# Patient Record
Sex: Female | Born: 1960 | Race: White | Hispanic: No | Marital: Married | Smoking: Former smoker
Health system: Southern US, Community
[De-identification: ages and names within clinical notes are randomized; demographics above are authoritative.]

## PROBLEM LIST (undated history)

## (undated) DIAGNOSIS — J3089 Other allergic rhinitis: Secondary | ICD-10-CM

## (undated) HISTORY — PX: BREAST ENHANCEMENT SURGERY: SHX7

## (undated) HISTORY — DX: Other allergic rhinitis: J30.89

---

## 2011-04-25 LAB — HM MAMMOGRAPHY: HM Mammogram: NORMAL

## 2012-03-11 ENCOUNTER — Encounter: Payer: Self-pay | Admitting: Endocrinology

## 2012-03-11 ENCOUNTER — Other Ambulatory Visit: Payer: BC Managed Care – PPO

## 2012-03-11 ENCOUNTER — Ambulatory Visit (INDEPENDENT_AMBULATORY_CARE_PROVIDER_SITE_OTHER): Payer: BC Managed Care – PPO | Admitting: Endocrinology

## 2012-03-11 VITALS — BP 122/82 | HR 63 | Temp 97.0°F | Ht 68.0 in | Wt 140.0 lb

## 2012-03-11 DIAGNOSIS — M810 Age-related osteoporosis without current pathological fracture: Secondary | ICD-10-CM

## 2012-03-11 MED ORDER — ALENDRONATE SODIUM 70 MG PO TABS: 70.0000 mg | ORAL_TABLET | ORAL | Status: DC

## 2012-03-11 NOTE — Progress Notes (Signed)
Subjective:    Patient ID: Alexandra Shelton, female    DOB: 10-15-1961, 51 y.o.   MRN: [id number]  HPI Pt says she was found to have mild osteoporosis in approx 2008.  She has not been on rx for this, except for vitamin-d rx.  Her next dexa was last month, and was worse.   She says she was noted to have hyperparathyroidism in 2008, but does not know if it was primary or secondary.  She was not given any rx for this, except for non-rx vit-d.   Aside from fx left forearm 3x as a child, no fx.  No h/o urolithiasis.  No falls,renal dz, thyroid problems, prolonged bedrest, steroids, smoking, liver dz, hereditary syndromes, dvt, or seizure.  etoh is 4-5/week. Past Medical History  Diagnosis Date  . Allergic rhinitis due to other allergen   . Osteoporosis     Past Surgical History  Procedure Date  . Breast enhancement surgery     History   Social History  . Marital Status: Married    Spouse Name: N/A    Number of Children: 3  . Years of Education: 57   Occupational History  . Librarian     Social History Main Topics  . Smoking status: Former Games developer  . Smokeless tobacco: Not on file  . Alcohol Use: Yes     occasionally wine  . Drug Use: No  . Sexually Active: Not on file   Other Topics Concern  . Not on file   Social History Narrative   Daily Caffeine Use-1-2 cups daily (coffee)Regular exercise-yes    No current outpatient prescriptions on file prior to visit.    Not on File  No family history on file. Mother has osteoporosis BP 122/82  Pulse 63  Temp(Src) 97 F (36.1 C) (Oral)  Ht 5\' 8"  (1.727 m)  Wt 140 lb (63.504 kg)  BMI 21.29 kg/m2  SpO2 97%  LMP 03/11/2012    Review of Systems Menses are decreasing.  denies cramps, difficulty with concentration, back pain, polyuria, depression, numbness, weight change, acne, muscle weakness, fever, headache, easy bruising, rhinorrhea, sob, rash, blurry vision, and chest pain.      Objective:   Physical Exam VS: see vs  page GEN: no distress HEAD: head: no deformity eyes: no periorbital swelling, no proptosis external nose and ears are normal mouth: no lesion seen NECK: supple, thyroid is not enlarged CHEST WALL: no deformity LUNGS:  Clear to auscultation CV: reg rate and rhythm, no murmur ABD: abdomen is soft, nontender.  no hepatosplenomegaly.  not distended.  no hernia MUSCULOSKELETAL: muscle bulk and strength are grossly normal.  no obvious joint swelling.  gait is normal and steady.  No kyphosis or other deformity.  EXTEMITIES: no deformity.  no ulcer on the feet.  feet are of normal color and temp.  no edema PULSES: dorsalis pedis intact bilat.   NEURO:  cn 2-12 grossly intact.   readily moves all 4's.  sensation is intact to touch on the feet SKIN:  Normal texture and temperature.  No rash or suspicious lesion is visible.   NODES:  None palpable at the neck. PSYCH: alert, oriented x3.  Does not appear anxious nor depressed.   outside test results are reviewed: Dexa: T-score at left hip= -2.7 (worsening) Vit-d=39 Tsh=normal Lab Results  Component Value Date   PTH 156.0* 03/11/2012   CALCIUM 8.8 03/11/2012      Assessment & Plan:  Secondary hyperparathyroidism, needs increased rx.  25-OH-vit-D does  not seem to be helping this.  Reason uncertain. Osteoporosis.  i don't think the above accounts for all of this.   Perimenopausal state.  Osteoporosis is out of proportion to what would be expected at this point.

## 2012-03-11 NOTE — Patient Instructions (Addendum)
blood tests are being requested for you today.  You will receive a letter with results. assuming your blood tests are normal, i have sent a prescription to your pharmacy, for "fosamax."  Please recheck the bone-density test at [facility] in 1 year.  If it does not improve, you should add or substitute another medication, such as "forteo," or "prolia." Please return here as needed.   (see letter)

## 2012-03-12 ENCOUNTER — Encounter: Payer: Self-pay | Admitting: Endocrinology

## 2012-03-12 LAB — PTH, INTACT AND CALCIUM: Calcium, Total (PTH): 8.8 mg/dL (ref 8.4–10.5)

## 2012-03-12 MED ORDER — CALCITRIOL 0.25 MCG PO CAPS: 0.2500 ug | ORAL_CAPSULE | Freq: Every day | ORAL | Status: AC

## 2012-03-13 ENCOUNTER — Encounter: Payer: Self-pay | Admitting: Endocrinology

## 2012-03-13 DIAGNOSIS — J3089 Other allergic rhinitis: Secondary | ICD-10-CM | POA: Insufficient documentation

## 2012-03-13 LAB — PROTEIN ELECTROPHORESIS, SERUM
Albumin ELP: 59.4 % (ref 55.8–66.1)
Alpha-1-Globulin: 4.2 % (ref 2.9–4.9)
Beta Globulin: 6.4 % (ref 4.7–7.2)
Total Protein, Serum Electrophoresis: 6.7 g/dL (ref 6.0–8.3)

## 2012-03-14 ENCOUNTER — Telehealth: Payer: Self-pay | Admitting: *Deleted

## 2012-03-14 NOTE — Telephone Encounter (Signed)
Called pt to inform of lab results, left message for pt to callback office (letter also mailed to pt). 

## 2012-03-15 NOTE — Telephone Encounter (Signed)
Left message for pt to callback office.  

## 2012-03-18 NOTE — Telephone Encounter (Signed)
You should take this med indefinitely, as the problem seldom resolves itself.

## 2012-03-18 NOTE — Telephone Encounter (Signed)
Pt informed of lab results. Pt wants what else she needs to do regarding hyperparathyroidism and if it will return.

## 2012-03-19 NOTE — Telephone Encounter (Signed)
You don't really need to, on the calcitriol

## 2012-03-19 NOTE — Telephone Encounter (Signed)
Pt informed

## 2012-03-19 NOTE — Telephone Encounter (Signed)
Pt informed. Pt wants to know if she should continue to take calcium supplement in addition to Fosamax.

## 2013-02-17 ENCOUNTER — Other Ambulatory Visit: Payer: Self-pay | Admitting: *Deleted

## 2013-02-17 MED ORDER — ALENDRONATE SODIUM 70 MG PO TABS: 70.0000 mg | ORAL_TABLET | ORAL | Status: DC

## 2013-02-20 ENCOUNTER — Other Ambulatory Visit: Payer: Self-pay

## 2013-02-20 MED ORDER — ALENDRONATE SODIUM 70 MG PO TABS: 70.0000 mg | ORAL_TABLET | ORAL | Status: DC

## 2013-04-17 ENCOUNTER — Other Ambulatory Visit: Payer: Self-pay | Admitting: *Deleted

## 2013-04-17 MED ORDER — CALCITRIOL 0.25 MCG PO CAPS: 0.2500 ug | ORAL_CAPSULE | Freq: Every day | ORAL | Status: DC

## 2013-06-10 ENCOUNTER — Other Ambulatory Visit: Payer: Self-pay | Admitting: *Deleted

## 2013-06-10 MED ORDER — CALCITRIOL 0.25 MCG PO CAPS: 0.2500 ug | ORAL_CAPSULE | Freq: Every day | ORAL | Status: DC

## 2013-06-23 ENCOUNTER — Other Ambulatory Visit: Payer: Self-pay

## 2013-06-24 ENCOUNTER — Other Ambulatory Visit: Payer: Self-pay | Admitting: *Deleted

## 2013-06-24 MED ORDER — ALENDRONATE SODIUM 70 MG PO TABS: 70.0000 mg | ORAL_TABLET | ORAL | Status: AC

## 2013-06-24 NOTE — Telephone Encounter (Signed)
Rx did not go through

## 2013-08-13 ENCOUNTER — Other Ambulatory Visit: Payer: Self-pay | Admitting: *Deleted

## 2014-04-24 ENCOUNTER — Telehealth: Payer: Self-pay | Admitting: Endocrinology

## 2014-04-24 NOTE — Telephone Encounter (Signed)
Please call dr redding's office.  i last saw this pt in 2013.  i would be happy to review DEXA

## 2014-04-28 NOTE — Telephone Encounter (Signed)
Dr. Marnee Guarneriedding's office to fax results over.

## 2014-04-28 NOTE — Telephone Encounter (Signed)
Requested call back to discuss.  

## 2014-04-29 ENCOUNTER — Telehealth: Payer: Self-pay | Admitting: Endocrinology

## 2014-04-29 NOTE — Telephone Encounter (Signed)
please call patient: Ov is due 

## 2014-04-29 NOTE — Telephone Encounter (Signed)
Patient is scheduled for 6/29. Pt states that she could not come in any earlier due to

## 2014-06-22 ENCOUNTER — Ambulatory Visit (INDEPENDENT_AMBULATORY_CARE_PROVIDER_SITE_OTHER): Payer: BC Managed Care – PPO | Admitting: Endocrinology

## 2014-06-22 ENCOUNTER — Encounter: Payer: Self-pay | Admitting: Endocrinology

## 2014-06-22 VITALS — BP 122/76 | HR 62 | Temp 97.9°F | Ht 68.0 in | Wt 139.0 lb

## 2014-06-22 DIAGNOSIS — M810 Age-related osteoporosis without current pathological fracture: Secondary | ICD-10-CM

## 2014-06-22 LAB — VITAMIN D 25 HYDROXY (VIT D DEFICIENCY, FRACTURES): VITD: 48.31 ng/mL

## 2014-06-22 NOTE — Progress Notes (Signed)
   Subjective:    Patient ID: Alexandra Shelton, female    DOB: 02/13/1961, 53 y.o.   MRN: [id number]  HPI Pt returns for f/u of osteoporosis (dx'ed approx 2008; she was rx'ed fosamax in 2013).  She was also noted to have secondary hyperparathyroidism in 2008.  She was rx'ed rocaltrol in 2013, but has been off x 1 week.  She also takes vit-D, 800 units/day.  Aside from fx left forearm 3x as a child, no h/o bony fx.  No h/o urolithiasis, renal dz, thyroid problems, prolonged bedrest, steroids, alcoholism, smoking, liver dz, hereditary syndromes, dvt, or seizure.  Past Medical History  Diagnosis Date  . Osteoporosis   . Allergic rhinitis due to other allergen     Past Surgical History  Procedure Laterality Date  . Breast enhancement surgery      History   Social History  . Marital Status: Married    Spouse Name: N/A    Number of Children: 3  . Years of Education: 3919   Occupational History  . Librarian     Social History Main Topics  . Smoking status: Former Games developermoker  . Smokeless tobacco: Not on file  . Alcohol Use: Yes     Comment: occasionally wine  . Drug Use: No  . Sexual Activity: Not on file   Other Topics Concern  . Not on file   Social History Narrative   Daily Caffeine Use-1-2 cups daily (coffee)   Regular exercise-yes    Current Outpatient Prescriptions on File Prior to Visit  Medication Sig Dispense Refill  . alendronate (FOSAMAX) 70 MG tablet Take 1 tablet (70 mg total) by mouth every 7 (seven) days. Take with a full glass of water on an empty stomach. NOTICE TO PATIENT PLEASE SCHEDULE YEARLY FOLLOW UP WITH DR. Everardo AllELLISON FOR FURTHER REFILL. LAST OFFICE VISIT 02/2012.  4 tablet  0  . calcitRIOL (ROCALTROL) 0.25 MCG capsule Take 1 capsule (0.25 mcg total) by mouth daily.  30 capsule  0  . NASONEX 50 MCG/ACT nasal spray Place 2 sprays into the nose daily.        No current facility-administered medications on file prior to visit.    Allergies  Allergen Reactions  .  Sulfamethoxazole     No family history on file.  BP 122/76  Pulse 62  Temp(Src) 97.9 F (36.6 C) (Oral)  Ht 5\' 8"  (1.727 m)  Wt 139 lb (63.05 kg)  BMI 21.14 kg/m2  SpO2 98%   Review of Systems Denies falls and LOC    Objective:   Physical Exam VITAL SIGNS:  See vs page GENERAL: no distress Chest wall: no kyphosis.  Gait: normal and steady.   i reviewed DEXA 25-OH-vit-D=48    Assessment & Plan:  Osteoporosis, improved Secondary hyperparathyroidism: she is due for recheck. Noncompliance with rx: I advised her to take meds as rx'ed.     Patient is advised the following: Patient Instructions  blood tests are being requested for you today.  We'll contact you with results. Please continue "fosamax."  Please return in 2 years.

## 2014-06-22 NOTE — Patient Instructions (Addendum)
blood tests are being requested for you today.  We'll contact you with results. Please continue "fosamax."  Please return in 2 years.

## 2014-06-24 ENCOUNTER — Other Ambulatory Visit: Payer: Self-pay | Admitting: Endocrinology

## 2014-06-24 LAB — PTH, INTACT AND CALCIUM
CALCIUM: 9.3 mg/dL (ref 8.4–10.5)
PTH: 41.4 pg/mL (ref 14.0–72.0)

## 2014-06-24 MED ORDER — CALCITRIOL 0.25 MCG PO CAPS: 0.2500 ug | ORAL_CAPSULE | Freq: Every day | ORAL | Status: DC

## 2016-08-07 DIAGNOSIS — N950 Postmenopausal bleeding: Secondary | ICD-10-CM | POA: Insufficient documentation

## 2018-05-22 ENCOUNTER — Encounter: Payer: Self-pay | Admitting: Cardiology

## 2018-05-22 ENCOUNTER — Ambulatory Visit: Payer: BC Managed Care – PPO | Admitting: Cardiology

## 2018-05-22 VITALS — BP 130/84 | HR 60 | Ht 68.0 in | Wt 147.0 lb

## 2018-05-22 DIAGNOSIS — Z8249 Family history of ischemic heart disease and other diseases of the circulatory system: Secondary | ICD-10-CM | POA: Insufficient documentation

## 2018-05-22 DIAGNOSIS — Q231 Congenital insufficiency of aortic valve: Secondary | ICD-10-CM

## 2018-05-22 NOTE — Progress Notes (Signed)
Cardiology Consultation:    Date:  05/22/2018   ID:  Alexandra Shelton, DOB 1961/02/20, MRN [id number]  PCP:  Alexandra Saupe, MD  Cardiologist:  Alexandra Balsam, MD   Referring MD: Alexandra Saupe, MD   Chief Complaint  Patient presents with  . Family History of Cardiac Issues  I get family history with aortic problem  History of Present Illness:    Alexandra Shelton is a 57 y.o. female who is being seen today for the evaluation of potential aortic problem at the request of Alexandra Saupe, MD.  Alexandra Shelton is my neighbor she recently learned about the fact that her brother has aortic aneurysm that required surgery.  When she started investigating that he should she find out that her father died at the age of 79 because of dissecting ascending aorta.  Obviously is very alarmed about it and she would like to be checked make sure everything is okay.  She does not have any symptoms no chest pain tightness squeezing pressure burning chest.  She is very active I see her walking all the time she has no difficulty doing it.  Does not have high blood pressure does not have diabetes her cholesterol apparently is perfect.  She never smoked.  Past Medical History:  Diagnosis Date  . Allergic rhinitis due to other allergen   . Osteoporosis       Current Medications: Current Meds  Medication Sig  . alendronate (FOSAMAX) 70 MG tablet Take 1 tablet by mouth once a week.  Marland Kitchen NASONEX 50 MCG/ACT nasal spray Place 2 sprays into the nose daily.      Allergies:   Sulfamethoxazole   Social History   Socioeconomic History  . Marital status: Married    Spouse name: Not on file  . Number of children: 3  . Years of education: 34  . Highest education level: Not on file  Occupational History  . Occupation: Interior and spatial designer  . Financial resource strain: Not on file  . Food insecurity:    Worry: Not on file    Inability: Not on file  . Transportation needs:    Medical: Not on file   Non-medical: Not on file  Tobacco Use  . Smoking status: Former Games developer  . Smokeless tobacco: Never Used  Substance and Sexual Activity  . Alcohol use: Yes    Comment: occasionally wine  . Drug use: No  . Sexual activity: Not on file  Lifestyle  . Physical activity:    Days per week: Not on file    Minutes per session: Not on file  . Stress: Not on file  Relationships  . Social connections:    Talks on phone: Not on file    Gets together: Not on file    Attends religious service: Not on file    Active member of club or organization: Not on file    Attends meetings of clubs or organizations: Not on file    Relationship status: Not on file  Other Topics Concern  . Not on file  Social History Narrative   Daily Caffeine Use-1-2 cups daily (coffee)   Regular exercise-yes     Family History: The patient's family history includes AAA (abdominal aortic aneurysm) in her brother; Aortic dissection in her father; Other in her father and mother. ROS:   Please see the history of present illness.    All 14 point review of systems negative except as described per history of  present illness.  EKGs/Labs/Other Studies Reviewed:    The following studies were reviewed today:   EKG:  EKG is  ordered today.  The ekg ordered today demonstrates normal sinus rhythm normal P interval normal QS complex duration morphology  Recent Labs: No results found for requested labs within last 8760 hours.  Recent Lipid Panel No results found for: CHOL, TRIG, HDL, CHOLHDL, VLDL, LDLCALC, LDLDIRECT  Physical Exam:    VS:  BP 130/84   Pulse 60   Ht  (1.727 m)   Wt 147 lb (66.7 kg)   SpO2 97%   BMI 22.35 kg/m     Wt Readings from Last 3 Encounters:  05/22/18 147 lb (66.7 kg)  06/22/14 139 lb (63 kg)  03/11/12 140 lb (63.5 kg)     GEN:  Well nourished, well developed in no acute distress HEENT: Normal NECK: No JVD; No carotid bruits LYMPHATICS: No lymphadenopathy CARDIAC: RRR, no murmurs,  no rubs, no gallops RESPIRATORY:  Clear to auscultation without rales, wheezing or rhonchi  ABDOMEN: Soft, non-tender, non-distended MUSCULOSKELETAL:  No edema; No deformity  SKIN: Warm and dry NEUROLOGIC:  Alert and oriented x 3 PSYCHIATRIC:  Normal affect   ASSESSMENT:    1. Family history of thoracic aortic aneurysm    PLAN:    In order of problems listed above:  1. Family history of aortic aneurysm.  Her father died suddenly because of rupture ascending aneurysm I did see autopsy report.  Her brother required surgery for ascending aortic aneurysm.  I will ask her to have CT of her chest and abdomen to look at your to make sure there is no aortopathy.  As a part of evaluation I will also ask her to have an echocardiogram to make sure he does not have bicuspid aortic valve. 2. Otherwise she is doing fine.  I see her back in my office in about 1 month or sooner if she get a problem.   Medication Adjustments/Labs and Tests Ordered: Current medicines are reviewed at length with the patient today.  Concerns regarding medicines are outlined above.  No orders of the defined types were placed in this encounter.  No orders of the defined types were placed in this encounter.   Signed, Alexandra Lea, MD, [facility]. 05/22/2018 5:17 PM    [facility]

## 2018-05-22 NOTE — Patient Instructions (Signed)
Medication Instructions:  Your physician recommends that you continue on your current medications as directed. Please refer to the Current Medication list given to you today.   Labwork: Your physician recommends that you return for lab work today: BMP. This lab work needs to be done in 3-7 days prior to CTA chest. You can come back into our office or go to your nearest [facility] to have this done. No appointment needed.   Testing/Procedures: You had an EKG today.   Your physician has requested that you have an echocardiogram. Echocardiography is a painless test that uses sound waves to create images of your heart. It provides your doctor with information about the size and shape of your heart and how well your heart's chambers and valves are working. This procedure takes approximately one hour. There are no restrictions for this procedure.  Chest CT Angiography (CTA), is a special type of CT scan that uses a computer to produce multi-dimensional views of major blood vessels throughout the body. In CT angiography, a contrast material is injected through an IV to help visualize the blood vessels   Follow-Up: Your physician recommends that you schedule a follow-up appointment in: 6 weeks.   If you need a refill on your cardiac medications before your next appointment, please call your pharmacy.   Thank you for choosing [facility]! Mady Gemma, RN [phone]

## 2018-06-10 ENCOUNTER — Ambulatory Visit (HOSPITAL_BASED_OUTPATIENT_CLINIC_OR_DEPARTMENT_OTHER)
Admission: RE | Admit: 2018-06-10 | Discharge: 2018-06-10 | Disposition: A | Discharge status: Home / Self Care | Payer: BC Managed Care – PPO | Source: Ambulatory Visit | Attending: Cardiology | Admitting: Cardiology

## 2018-06-10 DIAGNOSIS — Q231 Congenital insufficiency of aortic valve: Secondary | ICD-10-CM | POA: Diagnosis present

## 2018-06-10 DIAGNOSIS — I351 Nonrheumatic aortic (valve) insufficiency: Secondary | ICD-10-CM | POA: Diagnosis not present

## 2018-06-10 NOTE — Progress Notes (Signed)
  Echocardiogram 2D Echocardiogram has been performed.  Lucee Brissett T Chastelyn [place] 06/10/2018, 9:24 AM

## 2018-06-11 ENCOUNTER — Ambulatory Visit (INDEPENDENT_AMBULATORY_CARE_PROVIDER_SITE_OTHER)
Admission: RE | Admit: 2018-06-11 | Discharge: 2018-06-11 | Disposition: A | Discharge status: Home / Self Care | Payer: BC Managed Care – PPO | Source: Ambulatory Visit | Attending: Cardiology | Admitting: Cardiology

## 2018-06-11 DIAGNOSIS — Z8249 Family history of ischemic heart disease and other diseases of the circulatory system: Secondary | ICD-10-CM

## 2018-06-11 LAB — BASIC METABOLIC PANEL
BUN/Creatinine Ratio: 20 (ref 9–23)
BUN: 15 mg/dL (ref 6–24)
CO2: 28 mmol/L (ref 20–29)
Calcium: 9.9 mg/dL (ref 8.7–10.2)
Chloride: 99 mmol/L (ref 96–106)
Creatinine, Ser: 0.74 mg/dL (ref 0.57–1.00)
GFR calc non Af Amer: 90 mL/min/{1.73_m2} (ref >59)
GFR, EST AFRICAN AMERICAN: 104 mL/min/{1.73_m2} (ref >59)
GLUCOSE: 88 mg/dL (ref 65–99)
POTASSIUM: 3.8 mmol/L (ref 3.5–5.2)
Sodium: 138 mmol/L (ref 134–144)

## 2018-06-11 MED ORDER — IOPAMIDOL (ISOVUE-370) INJECTION 76%
100.0000 mL | Freq: Once | INTRAVENOUS | Status: AC | PRN
  Administered 2018-06-11: 100 mL via INTRAVENOUS

## 2018-07-10 ENCOUNTER — Ambulatory Visit: Payer: BC Managed Care – PPO | Admitting: Cardiology

## 2018-07-10 ENCOUNTER — Encounter: Payer: Self-pay | Admitting: Cardiology

## 2018-07-10 VITALS — BP 134/74 | HR 77 | Ht 68.0 in | Wt 143.8 lb

## 2018-07-10 DIAGNOSIS — I712 Thoracic aortic aneurysm, without rupture: Secondary | ICD-10-CM

## 2018-07-10 DIAGNOSIS — I351 Nonrheumatic aortic (valve) insufficiency: Secondary | ICD-10-CM | POA: Diagnosis not present

## 2018-07-10 DIAGNOSIS — Q231 Congenital insufficiency of aortic valve: Secondary | ICD-10-CM | POA: Diagnosis not present

## 2018-07-10 DIAGNOSIS — I7121 Aneurysm of the ascending aorta, without rupture: Secondary | ICD-10-CM

## 2018-07-10 MED ORDER — METOPROLOL SUCCINATE ER 25 MG PO TB24: 25.0000 mg | ORAL_TABLET | Freq: Every day | ORAL | 3 refills | Status: DC

## 2018-07-10 NOTE — Progress Notes (Signed)
Cardiology Office Note:    Date:  07/10/2018   ID:  Alexandra Shelton, DOB 11/29/1961, MRN [id number]  PCP:  Noni Saupeedding, John F. II, MD  Cardiologist:  Gypsy Balsamobert Krasowski, MD    Referring MD: Noni Saupeedding, John F. II, MD   Chief Complaint  Patient presents with  . 6 week follow up  I am here to discuss tests  History of Present Illness:    Alexandra MatterJulia Buboltz is a 57 y.o. female with bicuspid aortic valve, ascending aortic aneurysm.  Shane CrutchStory is quite interesting her father died suddenly because of rupture ascending aortic aneurysm recently her brother was discovered to have significant ascending aortic aneurysm that required surgery he also have aortic valve replacement with bioprosthetic valve secondary to bicuspid aortic valve she came to me along with all those findings and wanted to be checked echocardiogram revealed bicuspid aortic valve however no significant stenosis she does have mild to moderate aortic insufficiency.  Her CT of the chest revealed ascending aortic aneurysm measuring 4 cm.  She is here to discuss this findings.  She is asymptomatic no chest pain tightness squeezing pressure burning chest.  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  We spoke in length about this condition we were talking about risk factors will be talking about taking small dose of beta-blocker try to reduce progression of aneurysm.  We did also talk about follow-up that she will required.  I expect her to have echocardiogram done in about 6 months to see if there are any significant progression of her both aortic problem as well as aneurysm.  After that she probably will require yearly follow-up however in my opinion critical would be to understand genetics she will be referred to genetic clinic.  I was hoping that her brother who recently had aortic aneurysm I think will be checked for genetic abnormality but it looks like it is not the case we will refer her to genetic clinic.  We also talked about the fact  that her children need to be checked as well as numerous the family.  Her daughter had interesting anomaly of her eyes she had Noe Genseters anomaly which is fairly rare congenital condition.  I did some literature research however I did not find any correlation between aortopathy and Peters anomaly.  We did talk about exercises on the regular basis and advised against isovolumetric exercise.  Past Medical History:  Diagnosis Date  . Allergic rhinitis due to other allergen   . Osteoporosis       Current Medications: Current Meds  Medication Sig  . alendronate (FOSAMAX) 70 MG tablet Take 1 tablet by mouth once a week.  Marland Kitchen. NASONEX 50 MCG/ACT nasal spray Place 2 sprays into the nose daily.      Allergies:   Sulfamethoxazole   Social History   Socioeconomic History  . Marital status: Married    Spouse name: Not on file  . Number of children: 3  . Years of education: 5819  . Highest education level: Not on file  Occupational History  . Occupation: Interior and spatial designerLibrarian   Social Needs  . Financial resource strain: Not on file  . Food insecurity:    Worry: Not on file    Inability: Not on file  . Transportation needs:    Medical: Not on file    Non-medical: Not on file  Tobacco Use  . Smoking status: Former Games developermoker  . Smokeless tobacco: Never Used  Substance and Sexual Activity  . Alcohol use: Yes  Comment: occasionally wine  . Drug use: No  . Sexual activity: Not on file  Lifestyle  . Physical activity:    Days per week: Not on file    Minutes per session: Not on file  . Stress: Not on file  Relationships  . Social connections:    Talks on phone: Not on file    Gets together: Not on file    Attends religious service: Not on file    Active member of club or organization: Not on file    Attends meetings of clubs or organizations: Not on file    Relationship status: Not on file  Other Topics Concern  . Not on file  Social History Narrative   Daily Caffeine Use-1-2 cups daily (coffee)     Regular exercise-yes     Family History: The patient's family history includes AAA (abdominal aortic aneurysm) in her brother; Aortic dissection in her father; Other in her father and mother. ROS:   Please see the history of present illness.    All 14 point review of systems negative except as described per history of present illness  EKGs/Labs/Other Studies Reviewed:      Recent Labs: 06/10/2018: BUN 15; Creatinine, Ser 0.74; Potassium 3.8; Sodium 138  Recent Lipid Panel No results found for: CHOL, TRIG, HDL, CHOLHDL, VLDL, LDLCALC, LDLDIRECT  Physical Exam:    VS:  BP 134/74   Pulse 77   Ht 5\' 8"  (1.727 m)   Wt 143 lb 12.8 oz (65.2 kg)   SpO2 98%   BMI 21.86 kg/m     Wt Readings from Last 3 Encounters:  07/10/18 143 lb 12.8 oz (65.2 kg)  05/22/18 147 lb (66.7 kg)  06/22/14 139 lb (63 kg)     GEN:  Well nourished, well developed in no acute distress HEENT: Normal NECK: No JVD; No carotid bruits LYMPHATICS: No lymphadenopathy CARDIAC: RRR, diastolic murmur grade 2/6 best heard at left border of the sternum without radiation, no rubs, no gallops RESPIRATORY:  Clear to auscultation without rales, wheezing or rhonchi  ABDOMEN: Soft, non-tender, non-distended MUSCULOSKELETAL:  No edema; No deformity  SKIN: Warm and dry LOWER EXTREMITIES: no swelling NEUROLOGIC:  Alert and oriented x 3 PSYCHIATRIC:  Normal affect   ASSESSMENT:    No diagnosis found. PLAN:    In order of problems listed above:  1. Ascending aortic aneurysm: Measuring 4 cm.  No need to intervene right now however it critical is to avoid as a biometric exercise.  This put her on small dose of beta-blocker which I will do I will initiate 25 mg Toprol-XL.  I will also ask her to follow-up with genetic clinics. 2. Bicuspid aortic valve.  No significant stenosis but there is moderate aortic insufficiency.  We will continue monitoring.  No need to intervene right now.  Ejection fraction 55-60, left  ventricle both systolic and diastolic dimensions are normal.   Medication Adjustments/Labs and Tests Ordered: Current medicines are reviewed at length with the patient today.  Concerns regarding medicines are outlined above.  No orders of the defined types were placed in this encounter.  Medication changes:  Meds ordered this encounter  Medications  . metoprolol succinate (TOPROL-XL) 25 MG 24 hr tablet    Sig: Take 1 tablet (25 mg total) by mouth daily.    Dispense:  30 tablet    Refill:  3    Signed, Georgeanna Lea, MD, [facility] 07/10/2018 12:41 PM    [facility]

## 2018-07-10 NOTE — Patient Instructions (Signed)
Medication Instructions: Your physician has recommended you make the following change in your medication:   START metoprolol succinate (toprol xl) 25 mg daily.   Labwork: None.  Testing/Procedures: None  Follow-Up: Your physician wants you to follow-up in: 6 months. You will receive a reminder letter in the mail two months in advance. If you don't receive a letter, please call our office to schedule the follow-up appointment.   Any Other Special Instructions Will Be Listed Below (If Applicable).     If you need a refill on your cardiac medications before your next appointment, please call your pharmacy.    Metoprolol extended-release tablets What is this medicine? METOPROLOL (me TOE proe lole) is a beta-blocker. Beta-blockers reduce the workload on the heart and help it to beat more regularly. This medicine is used to treat high blood pressure and to prevent chest pain. It is also used to after a heart attack and to prevent an additional heart attack from occurring. This medicine may be used for other purposes; ask your health care provider or pharmacist if you have questions. COMMON BRAND NAME(S): toprol, Toprol XL What should I tell my health care provider before I take this medicine? They need to know if you have any of these conditions: -diabetes -heart or vessel disease like slow heart rate, worsening heart failure, heart block, sick sinus syndrome or Raynaud's disease -kidney disease -liver disease -lung or breathing disease, like asthma or emphysema -pheochromocytoma -thyroid disease -an unusual or allergic reaction to metoprolol, other beta-blockers, medicines, foods, dyes, or preservatives -pregnant or trying to get pregnant -breast-feeding How should I use this medicine? Take this medicine by mouth with a glass of water. Follow the directions on the prescription label. Do not crush or chew. Take this medicine with or immediately after meals. Take your doses at  regular intervals. Do not take more medicine than directed. Do not stop taking this medicine suddenly. This could lead to serious heart-related effects. Talk to your pediatrician regarding the use of this medicine in children. While this drug may be prescribed for children as young as 6 years for selected conditions, precautions do apply. Overdosage: If you think you have taken too much of this medicine contact a poison control center or emergency room at once. NOTE: This medicine is only for you. Do not share this medicine with others. What if I miss a dose? If you miss a dose, take it as soon as you can. If it is almost time for your next dose, take only that dose. Do not take double or extra doses. What may interact with this medicine? This medicine may interact with the following medications: -certain medicines for blood pressure, heart disease, irregular heart beat -certain medicines for depression, like monoamine oxidase (MAO) inhibitors, fluoxetine, or paroxetine -clonidine -dobutamine -epinephrine -isoproterenol -reserpine This list may not describe all possible interactions. Give your health care provider a list of all the medicines, herbs, non-prescription drugs, or dietary supplements you use. Also tell them if you smoke, drink alcohol, or use illegal drugs. Some items may interact with your medicine. What should I watch for while using this medicine? Visit your doctor or health care professional for regular check ups. Contact your doctor right away if your symptoms worsen. Check your blood pressure and pulse rate regularly. Ask your health care professional what your blood pressure and pulse rate should be, and when you should contact them. You may get drowsy or dizzy. Do not drive, use machinery, or do anything that  needs mental alertness until you know how this medicine affects you. Do not sit or stand up quickly, especially if you are an older patient. This reduces the risk of dizzy  or fainting spells. Contact your doctor if these symptoms continue. Alcohol may interfere with the effect of this medicine. Avoid alcoholic drinks. What side effects may I notice from receiving this medicine? Side effects that you should report to your doctor or health care professional as soon as possible: -allergic reactions like skin rash, itching or hives -cold or numb hands or feet -depression -difficulty breathing -faint -fever with sore throat -irregular heartbeat, chest pain -rapid weight gain -swollen legs or ankles Side effects that usually do not require medical attention (report to your doctor or health care professional if they continue or are bothersome): -anxiety or nervousness -change in sex drive or performance -dry skin -headache -nightmares or trouble sleeping -short term memory loss -stomach upset or diarrhea -unusually tired This list may not describe all possible side effects. Call your doctor for medical advice about side effects. You may report side effects to FDA at 1-800-FDA-1088. Where should I keep my medicine? Keep out of the reach of children. Store at room temperature between 15 and 30 degrees C (59 and 86 degrees F). Throw away any unused medicine after the expiration date. NOTE: This sheet is a summary. It may not cover all possible information. If you have questions about this medicine, talk to your doctor, pharmacist, or health care provider.  2018 Elsevier/Gold Standard (2013-08-15 14:41:37)

## 2018-07-19 ENCOUNTER — Other Ambulatory Visit: Payer: Self-pay | Admitting: *Deleted

## 2018-07-19 DIAGNOSIS — Z8249 Family history of ischemic heart disease and other diseases of the circulatory system: Secondary | ICD-10-CM

## 2018-07-19 DIAGNOSIS — I712 Thoracic aortic aneurysm, without rupture: Secondary | ICD-10-CM

## 2018-07-19 DIAGNOSIS — I7121 Aneurysm of the ascending aorta, without rupture: Secondary | ICD-10-CM

## 2018-08-02 ENCOUNTER — Telehealth: Payer: Self-pay | Admitting: *Deleted

## 2018-08-02 NOTE — Telephone Encounter (Signed)
Called to follow up with patient and make sure she was scheduled for an appointment with Dr. Barbette [facility]inoJensen [facility]. Patient confirms she has an appointment on 08/27/18 at 9 am. Dr. Bing MatterKrasowski has been notified of this information. No further questions.

## 2018-09-02 DIAGNOSIS — Z1379 Encounter for other screening for genetic and chromosomal anomalies: Secondary | ICD-10-CM | POA: Insufficient documentation

## 2018-12-12 ENCOUNTER — Telehealth: Payer: Self-pay | Admitting: Cardiology

## 2018-12-12 MED ORDER — METOPROLOL SUCCINATE ER 25 MG PO TB24: 25.0000 mg | ORAL_TABLET | Freq: Every day | ORAL | 1 refills | Status: DC

## 2018-12-12 NOTE — Telephone Encounter (Signed)
Metoprolol succinate 25 mg daily refilled 

## 2018-12-12 NOTE — Telephone Encounter (Signed)
°*  STAT* If patient is at the pharmacy, call can be transferred to refill team.   1. Which medications need to be refilled? (please list name of each medication and dose if known) Metoprolol   2. Which pharmacy/location (including street and city if local pharmacy) is medication to be sent to? [facility] [address]  3. Do they need a 30 day or 90 day supply? 90   SHE NEEDS TODAY!! She is out!

## 2019-01-06 ENCOUNTER — Encounter: Payer: Self-pay | Admitting: Cardiology

## 2019-01-06 ENCOUNTER — Ambulatory Visit: Payer: BC Managed Care – PPO | Admitting: Cardiology

## 2019-01-06 VITALS — BP 130/60 | HR 60 | Ht 68.0 in | Wt 137.6 lb

## 2019-01-06 DIAGNOSIS — I351 Nonrheumatic aortic (valve) insufficiency: Secondary | ICD-10-CM | POA: Diagnosis not present

## 2019-01-06 DIAGNOSIS — I7121 Aneurysm of the ascending aorta, without rupture: Secondary | ICD-10-CM

## 2019-01-06 DIAGNOSIS — I712 Thoracic aortic aneurysm, without rupture: Secondary | ICD-10-CM

## 2019-01-06 DIAGNOSIS — Q231 Congenital insufficiency of aortic valve: Secondary | ICD-10-CM | POA: Diagnosis not present

## 2019-01-06 DIAGNOSIS — Z8249 Family history of ischemic heart disease and other diseases of the circulatory system: Secondary | ICD-10-CM | POA: Diagnosis not present

## 2019-01-06 DIAGNOSIS — Q2381 Bicuspid aortic valve: Secondary | ICD-10-CM | POA: Insufficient documentation

## 2019-01-06 LAB — BASIC METABOLIC PANEL
BUN / CREAT RATIO: 26 — AB (ref 9–23)
BUN: 18 mg/dL (ref 6–24)
CHLORIDE: 96 mmol/L (ref 96–106)
CO2: 26 mmol/L (ref 20–29)
Calcium: 10.3 mg/dL — ABNORMAL HIGH (ref 8.7–10.2)
Creatinine, Ser: 0.69 mg/dL (ref 0.57–1.00)
GFR calc Af Amer: 111 mL/min/{1.73_m2} (ref >59)
GFR calc non Af Amer: 96 mL/min/{1.73_m2} (ref >59)
Glucose: 92 mg/dL (ref 65–99)
Potassium: 4.6 mmol/L (ref 3.5–5.2)
SODIUM: 137 mmol/L (ref 134–144)

## 2019-01-06 NOTE — Patient Instructions (Signed)
Medication Instructions:  Your physician recommends that you continue on your current medications as directed. Please refer to the Current Medication list given to you today.  If you need a refill on your cardiac medications before your next appointment, please call your pharmacy.   Lab work: Your physician recommends that you return for lab work today: bmp  If you have labs (blood work) drawn today and your tests are completely normal, you will receive your results only by: Marland Kitchen MyChart Message (if you have MyChart) OR . A paper copy in the mail If you have any lab test that is abnormal or we need to change your treatment, we will call you to review the results.  Testing/Procedures: Non-Cardiac CT Angiography (CTA), is a special type of CT scan that uses a computer to produce multi-dimensional views of major blood vessels throughout the body. In CT angiography, a contrast material is injected through an IV to help visualize the blood vessels   Your physician has requested that you have an abdominal aorta duplex. During this test, an ultrasound is used to evaluate the aorta. Allow 30 minutes for this exam. Do not eat after midnight the day before and avoid carbonated beverages  Your physician has requested that you have an echocardiogram. Echocardiography is a painless test that uses sound waves to create images of your heart. It provides your doctor with information about the size and shape of your heart and how well your heart's chambers and valves are working. This procedure takes approximately one hour. There are no restrictions for this procedure.    Follow-Up: At [facility], you and your health needs are our priority.  As part of our continuing mission to provide you with exceptional heart care, we have created designated Provider Care Teams.  These Care Teams include your primary Cardiologist (physician) and Advanced Practice Providers (APPs -  Physician Assistants and Nurse  Practitioners) who all work together to provide you with the care you need, when you need it. You will need a follow up appointment in 6 months.  Please call our office 2 months in advance to schedule this appointment.  You may see No primary care provider on file. or another member of our [employer] Provider Team in [place]: Norman Herrlich, MD . Belva Crome, MD  Any Other Special Instructions Will Be Listed Below (If Applicable).   Echocardiogram An echocardiogram is a procedure that uses painless sound waves (ultrasound) to produce an image of the heart. Images from an echocardiogram can provide important information about:  Signs of coronary artery disease (CAD).  Aneurysm detection. An aneurysm is a weak or damaged part of an artery wall that bulges out from the normal force of blood pumping through the body.  Heart size and shape. Changes in the size or shape of the heart can be associated with certain conditions, including heart failure, aneurysm, and CAD.  Heart muscle function.  Heart valve function.  Signs of a past heart attack.  Fluid buildup around the heart.  Thickening of the heart muscle.  A tumor or infectious growth around the heart valves. Tell a health care provider about:  Any allergies you have.  All medicines you are taking, including vitamins, herbs, eye drops, creams, and over-the-counter medicines.  Any blood disorders you have.  Any surgeries you have had.  Any medical conditions you have.  Whether you are pregnant or may be pregnant. What are the risks? Generally, this is a safe procedure. However, problems may occur,  including:  Allergic reaction to dye (contrast) that may be used during the procedure. What happens before the procedure? No specific preparation is needed. You may eat and drink normally. What happens during the procedure?   An IV tube may be inserted into one of your veins.  You may receive contrast through this  tube. A contrast is an injection that improves the quality of the pictures from your heart.  A gel will be applied to your chest.  A wand-like tool (transducer) will be moved over your chest. The gel will help to transmit the sound waves from the transducer.  The sound waves will harmlessly bounce off of your heart to allow the heart images to be captured in real-time motion. The images will be recorded on a computer. The procedure may vary among health care providers and hospitals. What happens after the procedure?  You may return to your normal, everyday life, including diet, activities, and medicines, unless your health care provider tells you not to do that. Summary  An echocardiogram is a procedure that uses painless sound waves (ultrasound) to produce an image of the heart.  Images from an echocardiogram can provide important information about the size and shape of your heart, heart muscle function, heart valve function, and fluid buildup around your heart.  You do not need to do anything to prepare before this procedure. You may eat and drink normally.  After the echocardiogram is completed, you may return to your normal, everyday life, unless your health care provider tells you not to do that. This information is not intended to replace advice given to you by your health care provider. Make sure you discuss any questions you have with your health care provider. Document Released: 12/08/2000 Document Revised: 01/13/2017 Document Reviewed: 01/13/2017 Elsevier Interactive Patient Education  2019 Elsevier Inc.   Abdominal Aortic Aneurysm  An aneurysm is a bulge in one of the blood vessels that carry blood away from the heart (artery). It happens when blood pushes up against a weak or damaged place in the wall of an artery. An abdominal aortic aneurysm happens in the main artery of the body (aorta). Some aneurysms may not cause problems. If it grows, it can burst or tear, causing  bleeding inside the body. This is an emergency. It needs to be treated right away. What are the causes? The exact cause of this condition is not known. What increases the risk? The following may make you more likely to get this condition:  Being a female who is 59 years of age or older.  Being white (Caucasian).  Using tobacco.  Having a family history of aneurysms.  Having the following conditions: ? Hardening of the arteries (arteriosclerosis). ? Inflammation of the walls of an artery (arteritis). ? Certain genetic conditions. ? Being very overweight (obesity). ? An infection in the wall of the aorta (infectious aortitis). ? High cholesterol. ? High blood pressure (hypertension). What are the signs or symptoms? Symptoms depend on the size of the aneurysm and how fast it is growing. Most grow slowly and do not cause any symptoms. If symptoms do occur, they may include:  Pain in the belly (abdomen), side, or back.  Feeling full after eating only small amounts of food.  Feeling a throbbing lump in the belly. Symptoms that the aneurysm has burst (ruptured) include:  Sudden, very bad pain in the belly, side, or back.  Feeling sick to your stomach (nauseous).  Throwing up (vomiting).  Feeling light-headed or passing  out. How is this treated? Treatment for this condition depends on:  The size of the aneurysm.  How fast it is growing.  Your age.  Your risk of having it burst. If your aneurysm is smaller than 2 inches (5 cm), your doctor may manage it by:  Checking it often to see if it is getting bigger. You may have an imaging test (ultrasound) to check it every 3-6 months, every year, or every few years.  Giving you medicines to: ? Control blood pressure. ? Treat pain. ? Fight infection. If your aneurysm is larger than 2 inches (5 cm), you may need surgery to fix it. Follow these instructions at home: Lifestyle  Do not use any products that have nicotine or  tobacco in them. This includes cigarettes, e-cigarettes, and chewing tobacco. If you need help quitting, ask your doctor.  Get regular exercise. Ask your doctor what types of exercise are best for you. Eating and drinking  Eat a heart-healthy diet. This includes eating plenty of: ? Fresh fruits and vegetables. ? Whole grains. ? Low-fat (lean) protein. ? Low-fat dairy products.  Avoid foods that are high in saturated fat and cholesterol. These foods include red meat and some dairy products.  Do not drink alcohol if: ? Your doctor tells you not to drink. ? You are pregnant, may be pregnant, or are planning to become pregnant.  If you drink alcohol: ? Limit how much you use to:  0-1 drink a day for women.  0-2 drinks a day for men. ? Be aware of how much alcohol is in your drink. In the [place] one drink equals any of these:  One typical bottle of beer (12 oz).  One-half glass of wine (5 oz).  One shot of hard liquor (1 oz). General instructions  Take over-the-counter and prescription medicines only as told by your doctor.  Keep your blood pressure within normal limits. Ask your doctor what your blood pressure should be.  Have your blood sugar (glucose) level and cholesterol levels checked regularly. Keep your blood sugar level and cholesterol levels within normal limits.  Avoid heavy lifting and activities that take a lot of effort. Ask your doctor what activities are safe for you.  Keep all follow-up visits as told by your doctor. This is important. ? Talk to your doctor about regular screenings to see if the aneurysm is getting bigger. Contact a doctor if you:  Have pain in your belly, side, or back.  Have a throbbing feeling in your belly.  Have a family history of aneurysms. Get help right away if you:  Have sudden, bad pain in your belly, side, or back.  Feel sick to your stomach.  Throw up.  Have trouble pooping (constipation).  Have trouble peeing  (urinating).  Feel light-headed.  Have a fast heart rate when you stand.  Have sweaty skin that is cold to the touch (clammy).  Have shortness of breath.  Have a fever. These symptoms may be an emergency. Do not wait to see if the symptoms will go away. Get medical help right away. Call your local emergency services (911 in the [place] Do not drive yourself to the hospital. Summary  An aneurysm is a bulge in one of the blood vessels that carry blood away from the heart (artery). Some aneurysms may not cause problems.  You may need to have yours checked often. If it grows, it can burst or tear. This causes bleeding inside the body. It needs to be  treated right away.  Follow instructions from your doctor about healthy lifestyle changes.  Keep all follow-up visits as told by your doctor. This is important. This information is not intended to replace advice given to you by your health care provider. Make sure you discuss any questions you have with your health care provider. Document Released: 04/07/2013 Document Revised: 07/20/2018 Document Reviewed: 07/20/2018 Elsevier Interactive Patient Education  2019 [employer]   CT Angiogram  A CT angiogram is a procedure to look at the blood vessels in various areas of the body. For this procedure, a large X-ray machine, called a CT scanner, takes detailed pictures of blood vessels that have been injected with a dye (contrast material). A CT angiogram allows your health care provider to see how well blood is flowing to the area of your body that is being checked. Your health care provider will be able to see if there are any problems, such as a blockage. Tell a health care provider about:  Any allergies you have.  All medicines you are taking, including vitamins, herbs, eye drops, creams, and over-the-counter medicines.  Any problems you or family members have had with anesthetic medicines.  Any blood disorders you have.  Any surgeries  you have had.  Any medical conditions you have.  Whether you are pregnant or may be pregnant.  Whether you are breastfeeding.  Any anxiety disorders, chronic pain, or other conditions you have that may increase your stress or prevent you from lying still. What are the risks? Generally, this is a safe procedure. However, problems may occur, including:  Infection.  Bleeding.  Allergic reactions to medicines or dyes.  Damage to other structures or organs.  Kidney damage from the dye or contrast that is used.  Increased risk of cancer from radiation exposure. This risk is low. Talk with your health care provider about: ? The risks and benefits of testing. ? How you can receive the lowest dose of radiation. What happens before the procedure?  Wear comfortable clothing and remove any jewelry.  Follow instructions from your health care provider about eating and drinking. For most people, instructions may include these actions: ? For 12 hours before the test, avoid caffeine. This includes tea, coffee, soda, and energy drinks or pills. ? For 3-4 hours before the test, stop eating or drinking anything but water. ? Stay well hydrated by continuing to drink water before the exam. This will help to clear the contrast dye from your body after the test.  Ask your health care provider about changing or stopping your regular medicines. This is especially important if you are taking diabetes medicines or blood thinners. What happens during the procedure?  An IV tube will be inserted into one of your veins.  You will be asked to lie on an exam table. This table will slide in and out of the CT machine during the procedure.  Contrast dye will be injected into the IV tube. You might feel warm, or you may get a metallic taste in your mouth.  The table that you are lying on will move into the CT machine tunnel for the scan.  The person running the machine will give you instructions while the  scans are being done. You may be asked to: ? Keep your arms above your head. ? Hold your breath. ? Stay very still, even if the table is moving.  When the scanning is complete, you will be moved out of the machine.  The IV tube  will be removed. The procedure may vary among health care providers and hospitals. What happens after the procedure?  You might feel warm, or you may get a metallic taste in your mouth.  You may be asked to drink water or other fluids to wash (flush) the contrast material out of your body.  It is up to you to get the results of your procedure. Ask your health care provider, or the department that is doing the procedure, when your results will be ready. Summary  A CT angiogram is a procedure to look at the blood vessels in various areas of the body.  You will need to stay very still during the exam.  You may be asked to drink water or other fluids to wash (flush) the contrast material out of your body after your scan. This information is not intended to replace advice given to you by your health care provider. Make sure you discuss any questions you have with your health care provider. Document Released: 08/10/2016 Document Revised: 08/10/2016 Document Reviewed: 08/10/2016 Elsevier Interactive Patient Education  [employer].

## 2019-01-06 NOTE — Progress Notes (Signed)
Cardiology Office Note:    Date:  01/06/2019   ID:  Alexandra Shelton, DOB 1961-04-27, MRN [id number]  PCP:  Noni Saupe, MD  Cardiologist:  Gypsy Balsam, MD    Referring MD: Noni Saupe, MD   Chief Complaint  Patient presents with  . Follow-up  Doing well, asymptomatic  History of Present Illness:    Alexandra Shelton is a 58 y.o. female with family history of aortic aneurysm with bicuspid aortic valve, presented to me 6 months ago echocardiogram done showed bicuspid aortic valve with aortic root enlargement measuring 4 cm since that time she is doing well actually she was referred to genetic clinic in [place].  No gene was identified.  Overall she is doing well no chest pain tightness squeezing pressure burning chest.  Still very active understand this cannot do as much exercise.  It is time to repeat her CT of the chest as well as I will schedule her to have abdominal ultrasound make sure there is no enlargement in the abdomen or to a total time to repeat her echocardiogram.  If those tests will be unchanged then will continue on a yearly basis  Past Medical History:  Diagnosis Date  . Allergic rhinitis due to other allergen   . Osteoporosis       Current Medications: Current Meds  Medication Sig  . alendronate (FOSAMAX) 70 MG tablet Take 1 tablet by mouth once a week.  . metoprolol succinate (TOPROL-XL) 25 MG 24 hr tablet Take 1 tablet (25 mg total) by mouth daily.  Marland Kitchen NASONEX 50 MCG/ACT nasal spray Place 2 sprays into the nose daily.      Allergies:   Sulfamethoxazole   Social History   Socioeconomic History  . Marital status: Married    Spouse name: Not on file  . Number of children: 3  . Years of education: 66  . Highest education level: Not on file  Occupational History  . Occupation: Interior and spatial designer  . Financial resource strain: Not on file  . Food insecurity:    Worry: Not on file    Inability: Not on file  . Transportation needs:   Medical: Not on file    Non-medical: Not on file  Tobacco Use  . Smoking status: Former Games developer  . Smokeless tobacco: Never Used  Substance and Sexual Activity  . Alcohol use: Yes    Comment: occasionally wine  . Drug use: No  . Sexual activity: Not on file  Lifestyle  . Physical activity:    Days per week: Not on file    Minutes per session: Not on file  . Stress: Not on file  Relationships  . Social connections:    Talks on phone: Not on file    Gets together: Not on file    Attends religious service: Not on file    Active member of club or organization: Not on file    Attends meetings of clubs or organizations: Not on file    Relationship status: Not on file  Other Topics Concern  . Not on file  Social History Narrative   Daily Caffeine Use-1-2 cups daily (coffee)   Regular exercise-yes     Family History: The patient's family history includes AAA (abdominal aortic aneurysm) in her brother; Aortic dissection in her father; Other in her father and mother. ROS:   Please see the history of present illness.    All 14 point review of systems negative except as  described per history of present illness  EKGs/Labs/Other Studies Reviewed:      Recent Labs: 06/10/2018: BUN 15; Creatinine, Ser 0.74; Potassium 3.8; Sodium 138  Recent Lipid Panel No results found for: CHOL, TRIG, HDL, CHOLHDL, VLDL, LDLCALC, LDLDIRECT  Physical Exam:    VS:  BP 130/60   Pulse 60   Ht 5\' 8"  (1.727 m)   Wt 137 lb 9.6 oz (62.4 kg)   SpO2 97%   BMI 20.92 kg/m     Wt Readings from Last 3 Encounters:  01/06/19 137 lb 9.6 oz (62.4 kg)  07/10/18 143 lb 12.8 oz (65.2 kg)  05/22/18 147 lb (66.7 kg)     GEN:  Well nourished, well developed in no acute distress HEENT: Normal NECK: No JVD; No carotid bruits LYMPHATICS: No lymphadenopathy CARDIAC: RRR, diastolic murmur grade 2/6, no rubs, no gallops RESPIRATORY:  Clear to auscultation without rales, wheezing or rhonchi  ABDOMEN: Soft,  non-tender, non-distended MUSCULOSKELETAL:  No edema; No deformity  SKIN: Warm and dry LOWER EXTREMITIES: no swelling NEUROLOGIC:  Alert and oriented x 3 PSYCHIATRIC:  Normal affect   ASSESSMENT:    1. Ascending aortic aneurysm (HCC)   2. Bicuspid aortic valve   3. Nonrheumatic aortic valve insufficiency   4. Family history of thoracic aortic aneurysm    PLAN:    In order of problems listed above:  1. Bicuspid aortic valve echocardiogram will be done to recheck the valve. 2. Aortic aneurysm will get CT of the chest with contrast again.  Also I will schedule her to have abdominal ultrasound to look at the abdominal aorta.   Medication Adjustments/Labs and Tests Ordered: Current medicines are reviewed at length with the patient today.  Concerns regarding medicines are outlined above.  Orders Placed This Encounter  Procedures  . CT ANGIO CHEST AORTA W &/OR WO CONTRAST  . Basic metabolic panel  . ECHOCARDIOGRAM COMPLETE   Medication changes: No orders of the defined types were placed in this encounter.   Signed, Georgeanna Lea, MD, [facility] 01/06/2019 11:08 AM    [facility]

## 2019-01-10 ENCOUNTER — Ambulatory Visit (HOSPITAL_BASED_OUTPATIENT_CLINIC_OR_DEPARTMENT_OTHER)
Admission: RE | Admit: 2019-01-10 | Discharge: 2019-01-10 | Disposition: A | Discharge status: Home / Self Care | Payer: BC Managed Care – PPO | Source: Ambulatory Visit | Attending: Cardiology | Admitting: Cardiology

## 2019-01-10 DIAGNOSIS — I712 Thoracic aortic aneurysm, without rupture: Secondary | ICD-10-CM | POA: Diagnosis present

## 2019-01-10 MED ORDER — IOPAMIDOL (ISOVUE-370) INJECTION 76%
100.0000 mL | Freq: Once | INTRAVENOUS | Status: AC | PRN
  Administered 2019-01-10: 100 mL via INTRAVENOUS

## 2019-02-25 ENCOUNTER — Ambulatory Visit (INDEPENDENT_AMBULATORY_CARE_PROVIDER_SITE_OTHER): Payer: BC Managed Care – PPO

## 2019-02-25 DIAGNOSIS — I712 Thoracic aortic aneurysm, without rupture: Secondary | ICD-10-CM

## 2019-02-25 DIAGNOSIS — Q231 Congenital insufficiency of aortic valve: Secondary | ICD-10-CM

## 2019-02-25 DIAGNOSIS — I351 Nonrheumatic aortic (valve) insufficiency: Secondary | ICD-10-CM | POA: Diagnosis not present

## 2019-02-25 NOTE — Progress Notes (Signed)
Abdominal aortic duplex exam has been performed.  Alexandra Shelton, RDCS, RVT

## 2019-02-25 NOTE — Progress Notes (Signed)
Complete echocardiogram has been performed.  Jimmy Lilyrose Tanney RDCS, RVT 

## 2019-06-02 DIAGNOSIS — I714 Abdominal aortic aneurysm, without rupture, unspecified: Secondary | ICD-10-CM | POA: Insufficient documentation

## 2019-07-21 ENCOUNTER — Other Ambulatory Visit: Payer: Self-pay | Admitting: Cardiology

## 2019-09-18 ENCOUNTER — Other Ambulatory Visit: Payer: Self-pay

## 2019-09-18 ENCOUNTER — Encounter: Payer: Self-pay | Admitting: Cardiology

## 2019-09-18 ENCOUNTER — Ambulatory Visit (INDEPENDENT_AMBULATORY_CARE_PROVIDER_SITE_OTHER): Payer: BC Managed Care – PPO | Admitting: Cardiology

## 2019-09-18 VITALS — BP 114/60 | HR 69 | Ht 68.0 in | Wt 142.4 lb

## 2019-09-18 DIAGNOSIS — Q231 Congenital insufficiency of aortic valve: Secondary | ICD-10-CM

## 2019-09-18 DIAGNOSIS — Z8249 Family history of ischemic heart disease and other diseases of the circulatory system: Secondary | ICD-10-CM

## 2019-09-18 DIAGNOSIS — I712 Thoracic aortic aneurysm, without rupture: Secondary | ICD-10-CM | POA: Diagnosis not present

## 2019-09-18 DIAGNOSIS — I7121 Aneurysm of the ascending aorta, without rupture: Secondary | ICD-10-CM

## 2019-09-18 NOTE — Patient Instructions (Signed)
Medication Instructions:  Your physician recommends that you continue on your current medications as directed. Please refer to the Current Medication list given to you today.  If you need a refill on your cardiac medications before your next appointment, please call your pharmacy.   Lab work: Your physician recommends that you return for lab work within 3-7 days before your CTA chest aorta w/wo contrast in February 2021: BMP. Please return to our office for lab work, no appointment needed. No need to fast beforehand.   If you have labs (blood work) drawn today and your tests are completely normal, you will receive your results only by: Marland Kitchen MyChart Message (if you have MyChart) OR . A paper copy in the mail If you have any lab test that is abnormal or we need to change your treatment, we will call you to review the results.  Testing/Procedures: Non-Cardiac CT Angiography (CTA), is a special type of CT scan that uses a computer to produce multi-dimensional views of major blood vessels throughout the body. In CT angiography, a contrast material is injected through an IV to help visualize the blood vessels. The CTA chest aorta w/wo contrast will be scheduled for February 2021 at [facility].   Your physician has requested that you have an echocardiogram. Echocardiography is a painless test that uses sound waves to create images of your heart. It provides your doctor with information about the size and shape of your heart and how well your heart's chambers and valves are working. This procedure takes approximately one hour. There are no restrictions for this procedure. This will be scheduled for February 2021 in our [place] office.   Follow-Up: At [facility], you and your health needs are our priority.  As part of our continuing mission to provide you with exceptional heart care, we have created designated Provider Care Teams.  These Care Teams include your primary Cardiologist (physician) and  Advanced Practice Providers (APPs -  Physician Assistants and Nurse Practitioners) who all work together to provide you with the care you need, when you need it. You will need a follow up appointment in 1 years.  Please call our office 2 months in advance to schedule this appointment.

## 2019-09-18 NOTE — Progress Notes (Signed)
Cardiology Office Note:    Date:  09/18/2019   ID:  [name], DOB 1961-07-13, MRN [id number]  PCP:  Noni Saupe, MD  Cardiologist:  Gypsy Balsam, MD    Referring MD: Noni Saupe, MD   Chief Complaint  Patient presents with  . Follow-up  Doing well  History of Present Illness:    Alexandra Shelton is a 58 y.o. female with history of ascending aortic aneurysm, bicuspid aortic valve overall doing well asymptomatic walks and exercise on a regular basis he she knows about avoidance of isovolumetric exercises.  Overall doing well.  Past Medical History:  Diagnosis Date  . Allergic rhinitis due to other allergen   . Osteoporosis       Current Medications: No outpatient medications have been marked as taking for the 09/18/19 encounter (Office Visit) with Georgeanna Lea, MD.     Allergies:   Sulfamethoxazole   Social History   Socioeconomic History  . Marital status: Married    Spouse name: Not on file  . Number of children: 3  . Years of education: 21  . Highest education level: Not on file  Occupational History  . Occupation: Interior and spatial designer  . Financial resource strain: Not on file  . Food insecurity    Worry: Not on file    Inability: Not on file  . Transportation needs    Medical: Not on file    Non-medical: Not on file  Tobacco Use  . Smoking status: Former Games developer  . Smokeless tobacco: Never Used  Substance and Sexual Activity  . Alcohol use: Yes    Comment: occasionally wine  . Drug use: No  . Sexual activity: Not on file  Lifestyle  . Physical activity    Days per week: Not on file    Minutes per session: Not on file  . Stress: Not on file  Relationships  . Social Musician on phone: Not on file    Gets together: Not on file    Attends religious service: Not on file    Active member of club or organization: Not on file    Attends meetings of clubs or organizations: Not on file    Relationship status: Not  on file  Other Topics Concern  . Not on file  Social History Narrative   Daily Caffeine Use-1-2 cups daily (coffee)   Regular exercise-yes     Family History: The patient's family history includes AAA (abdominal aortic aneurysm) in her brother; Aortic dissection in her father; Other in her father and mother. ROS:   Please see the history of present illness.    All 14 point review of systems negative except as described per history of present illness  EKGs/Labs/Other Studies Reviewed:      Recent Labs: 01/06/2019: BUN 18; Creatinine, Ser 0.69; Potassium 4.6; Sodium 137  Recent Lipid Panel No results found for: CHOL, TRIG, HDL, CHOLHDL, VLDL, LDLCALC, LDLDIRECT  Physical Exam:    VS:  BP 114/60   Pulse 69   Ht 5\' 8"  (1.727 m)   Wt 142 lb 6.4 oz (64.6 kg)   SpO2 99%   BMI 21.65 kg/m     Wt Readings from Last 3 Encounters:  09/18/19 142 lb 6.4 oz (64.6 kg)  01/06/19 137 lb 9.6 oz (62.4 kg)  07/10/18 143 lb 12.8 oz (65.2 kg)     GEN:  Well nourished, well developed in no acute distress HEENT: Normal NECK:  No JVD; No carotid bruits LYMPHATICS: No lymphadenopathy CARDIAC: RRR, soft systolic murmur grade 1/6 best heard right upper portion of the sternum, there is also soft diastolic murmur grade 2/6 best heard at the left border of the sternum, no rubs, no gallops RESPIRATORY:  Clear to auscultation without rales, wheezing or rhonchi  ABDOMEN: Soft, non-tender, non-distended MUSCULOSKELETAL:  No edema; No deformity  SKIN: Warm and dry LOWER EXTREMITIES: no swelling NEUROLOGIC:  Alert and oriented x 3 PSYCHIATRIC:  Normal affect   ASSESSMENT:    1. Ascending aortic aneurysm ([place])   2. Bicuspid aortic valve   3. Family history of thoracic aortic aneurysm    PLAN:    In order of problems listed above:  1. Ascending octagon rhythm.  Time for her to have another CT of her chest will be in January which we will do.  We will try to do echocardiogram at the same time.   And I will follow-up periodically probably on a yearly basis unless this becomes larger or she develops symptoms. 2. Bicuspid aortic valve.  Does not have significant stenosis. 3. Family history of thoracic aneurysm noted   Medication Adjustments/Labs and Tests Ordered: Current medicines are reviewed at length with the patient today.  Concerns regarding medicines are outlined above.  Orders Placed This Encounter  Procedures  . CT ANGIO CHEST AORTA W &/OR WO CONTRAST  . Basic Metabolic Panel (BMET)  . ECHOCARDIOGRAM COMPLETE   Medication changes: No orders of the defined types were placed in this encounter.   Signed, Park Liter, MD, [facility] 09/18/2019 4:46 PM    [place]

## 2020-02-04 ENCOUNTER — Telehealth: Payer: Self-pay | Admitting: Emergency Medicine

## 2020-02-04 DIAGNOSIS — I7121 Aneurysm of the ascending aorta, without rupture: Secondary | ICD-10-CM

## 2020-02-04 DIAGNOSIS — I712 Thoracic aortic aneurysm, without rupture: Secondary | ICD-10-CM

## 2020-02-04 NOTE — Telephone Encounter (Signed)
Called patient per Dr. Vanetta Shawl request she reports she did have a CT in September 2020. She is going to check her calender and get back with me because she is at a dentist appointment.

## 2020-02-05 NOTE — Telephone Encounter (Signed)
Called patient back. Informed her we will need to schedule her for a CT chest aorta but will need labs first. She will come to office to have labs drawn tomorrow and then we will get her scheduled at [facility] after the labs result.

## 2020-02-05 NOTE — Addendum Note (Signed)
Addended by: Lita Mains on: 02/05/2020 02:36 PM   Modules accepted: Orders

## 2020-02-10 LAB — BASIC METABOLIC PANEL
BUN/Creatinine Ratio: 15 (ref 9–23)
BUN: 9 mg/dL (ref 6–24)
CO2: 25 mmol/L (ref 20–29)
Calcium: 9.5 mg/dL (ref 8.7–10.2)
Chloride: 102 mmol/L (ref 96–106)
Creatinine, Ser: 0.59 mg/dL (ref 0.57–1.00)
GFR calc Af Amer: 116 mL/min/{1.73_m2} (ref >59)
GFR calc non Af Amer: 101 mL/min/{1.73_m2} (ref >59)
Glucose: 105 mg/dL — ABNORMAL HIGH (ref 65–99)
Potassium: 4.1 mmol/L (ref 3.5–5.2)
Sodium: 140 mmol/L (ref 134–144)

## 2020-02-10 NOTE — Telephone Encounter (Signed)
Left message for patient to return call to inform her again we still need lab work.

## 2020-02-12 ENCOUNTER — Telehealth: Payer: Self-pay | Admitting: *Deleted

## 2020-02-12 NOTE — Telephone Encounter (Signed)
-----   Message from Georgeanna Lea, MD sent at 02/11/2020 12:40 PM EST ----- Chem-7 normal, c CBC 300

## 2020-02-12 NOTE — Telephone Encounter (Signed)
Patient informed. Copy sent to PCP °

## 2020-02-18 ENCOUNTER — Ambulatory Visit (INDEPENDENT_AMBULATORY_CARE_PROVIDER_SITE_OTHER): Payer: BC Managed Care – PPO

## 2020-02-18 ENCOUNTER — Other Ambulatory Visit: Payer: Self-pay

## 2020-02-18 DIAGNOSIS — I712 Thoracic aortic aneurysm, without rupture: Secondary | ICD-10-CM

## 2020-02-18 DIAGNOSIS — Q231 Congenital insufficiency of aortic valve: Secondary | ICD-10-CM

## 2020-02-18 NOTE — Progress Notes (Signed)
Complete echocardiogram has been performed.  Jimmy Pershing Skidmore RDCS, RVT 

## 2020-03-03 ENCOUNTER — Telehealth: Payer: Self-pay | Admitting: Cardiology

## 2020-03-03 NOTE — Telephone Encounter (Signed)
Spoke with patient . Informed of echo results.

## 2020-03-03 NOTE — Telephone Encounter (Signed)
Patient returning Hayley's call in regards to echo results.  

## 2020-04-15 ENCOUNTER — Other Ambulatory Visit: Payer: Self-pay | Admitting: Cardiology

## 2020-09-17 ENCOUNTER — Other Ambulatory Visit: Payer: Self-pay | Admitting: Cardiology

## 2020-10-15 ENCOUNTER — Other Ambulatory Visit: Payer: Self-pay | Admitting: Cardiology

## 2020-10-15 NOTE — Telephone Encounter (Signed)
Rx refill sent to pharmacy. 

## 2020-10-26 ENCOUNTER — Encounter: Payer: Self-pay | Admitting: Cardiology

## 2020-10-26 ENCOUNTER — Ambulatory Visit: Payer: BC Managed Care – PPO | Admitting: Cardiology

## 2020-10-26 ENCOUNTER — Other Ambulatory Visit: Payer: Self-pay

## 2020-10-26 VITALS — BP 130/80 | HR 68 | Ht 68.0 in | Wt 140.0 lb

## 2020-10-26 DIAGNOSIS — Q231 Congenital insufficiency of aortic valve: Secondary | ICD-10-CM | POA: Diagnosis not present

## 2020-10-26 DIAGNOSIS — Z8249 Family history of ischemic heart disease and other diseases of the circulatory system: Secondary | ICD-10-CM

## 2020-10-26 DIAGNOSIS — I7121 Aneurysm of the ascending aorta, without rupture: Secondary | ICD-10-CM

## 2020-10-26 DIAGNOSIS — I712 Thoracic aortic aneurysm, without rupture: Secondary | ICD-10-CM

## 2020-10-26 NOTE — Progress Notes (Signed)
Cardiology Office Note:    Date:  10/26/2020   ID:  Alexandra Shelton, DOB 08-27-1961, MRN [id number]  PCP:  Noni Saupe, MD  Cardiologist:  Gypsy Balsam, MD    Referring MD: Noni Saupe, MD   Chief Complaint  Patient presents with  . Follow-up  I am doing great  History of Present Illness:    Alexandra Shelton is a 59 y.o. female with past medical history significant for ascending aortic aneurysm, measuring 4 cm based on last measurement but CT from February 2021, bicuspid aortic valve without significant stenosis no regurgitation, family history of aneurysms, however genetic testing negative so far.  She comes today 2 months of follow-up overall she is doing great.  She is very active she likes to walk exercise have no difficulty doing this.  Recently she retired and she enjoyed her retirement a lot she is spending more time taking care of herself.  Denies have any cardiac complaints.  Past Medical History:  Diagnosis Date  . Allergic rhinitis due to other allergen   . Osteoporosis       Current Medications: Current Meds  Medication Sig  . fexofenadine (ALLEGRA) 180 MG tablet Take by mouth.  . metoprolol succinate (TOPROL-XL) 25 MG 24 hr tablet TAKE 1 TABLET BY MOUTH EVERY DAY  . Multiple Vitamin (MULTIVITAMIN) capsule Take 1 capsule by mouth daily.  Marland Kitchen NASONEX 50 MCG/ACT nasal spray Place 2 sprays into the nose daily.   . vitamin B-12 (CYANOCOBALAMIN) 500 MCG tablet Take by mouth.  . zoledronic acid (RECLAST) 5 MG/100ML SOLN injection Inject 5 mg into the vein once.     Allergies:   Sulfamethoxazole   Social History   Socioeconomic History  . Marital status: Married    Spouse name: Not on file  . Number of children: 3  . Years of education: 60  . Highest education level: Not on file  Occupational History  . Occupation: Librarian   Tobacco Use  . Smoking status: Former Games developer  . Smokeless tobacco: Never Used  Vaping Use  . Vaping Use: Never used    Substance and Sexual Activity  . Alcohol use: Yes    Comment: occasionally wine  . Drug use: No  . Sexual activity: Not on file  Other Topics Concern  . Not on file  Social History Narrative   Daily Caffeine Use-1-2 cups daily (coffee)   Regular exercise-yes   Social Determinants of Health   Financial Resource Strain:   . Difficulty of Paying Living Expenses: Not on file  Food Insecurity:   . Worried About Programme researcher, broadcasting/film/video in the Last Year: Not on file  . Ran Out of Food in the Last Year: Not on file  Transportation Needs:   . Lack of Transportation (Medical): Not on file  . Lack of Transportation (Non-Medical): Not on file  Physical Activity:   . Days of Exercise per Week: Not on file  . Minutes of Exercise per Session: Not on file  Stress:   . Feeling of Stress : Not on file  Social Connections:   . Frequency of Communication with Friends and Family: Not on file  . Frequency of Social Gatherings with Friends and Family: Not on file  . Attends Religious Services: Not on file  . Active Member of Clubs or Organizations: Not on file  . Attends Banker Meetings: Not on file  . Marital Status: Not on file     Family History: The  patient's family history includes AAA (abdominal aortic aneurysm) in her brother; Aortic dissection in her father; Other in her father and mother. ROS:   Please see the history of present illness.    All 14 point review of systems negative except as described per history of present illness  EKGs/Labs/Other Studies Reviewed:      Recent Labs: 02/06/2020: BUN 9; Creatinine, Ser 0.59; Potassium 4.1; Sodium 140  Recent Lipid Panel No results found for: CHOL, TRIG, HDL, CHOLHDL, VLDL, LDLCALC, LDLDIRECT  Physical Exam:    VS:  BP 130/80 (BP Location: Right Arm, Patient Position: Sitting, Cuff Size: Normal)   Pulse 68   Ht 5\' 8"  (1.727 m)   Wt 140 lb (63.5 kg)   SpO2 99%   BMI 21.29 kg/m     Wt Readings from Last 3  Encounters:  10/26/20 140 lb (63.5 kg)  09/18/19 142 lb 6.4 oz (64.6 kg)  01/06/19 137 lb 9.6 oz (62.4 kg)     GEN:  Well nourished, well developed in no acute distress HEENT: Normal NECK: No JVD; No carotid bruits LYMPHATICS: No lymphadenopathy CARDIAC: RRR, no murmurs, no rubs, no gallops RESPIRATORY:  Clear to auscultation without rales, wheezing or rhonchi  ABDOMEN: Soft, non-tender, non-distended MUSCULOSKELETAL:  No edema; No deformity  SKIN: Warm and dry LOWER EXTREMITIES: no swelling NEUROLOGIC:  Alert and oriented x 3 PSYCHIATRIC:  Normal affect   ASSESSMENT:    1. Ascending aortic aneurysm (HCC)   2. Bicuspid aortic valve   3. Family history of thoracic aortic aneurysm    PLAN:    In order of problems listed above:  1. Ascending aortic aneurysm measuring 40 mm based on last CT from February of this year.  We will schedule her to have another CT next year.  At the same time we will repeat echocardiogram.  We did talk about the need to avoid isovolumetric exercises which she does. 2. Bicuspid arctic valve: Echocardiogram be done next year. 3. Family history of thoracic aortic aneurysm.  Noted.  In her situation bicuspid aortic valve with probably need to consider intervention when her aneurysm reaches 50 to 55 mm. 4. Dyslipidemia she does have quite interesting cholesterol her HDL is 85 and LDL is 117.  Calculated 10 years risk was 2.9%.  No need to use medication just diet and lifestyle modifications.  We did talk in length about that.   Medication Adjustments/Labs and Tests Ordered: Current medicines are reviewed at length with the patient today.  Concerns regarding medicines are outlined above.  No orders of the defined types were placed in this encounter.  Medication changes: No orders of the defined types were placed in this encounter.   Signed, March, MD, [facility] 10/26/2020 9:20 AM    [facility]

## 2020-10-26 NOTE — Patient Instructions (Signed)
Medication Instructions:  Your physician recommends that you continue on your current medications as directed. Please refer to the Current Medication list given to you today.  *If you need a refill on your cardiac medications before your next appointment, please call your pharmacy*   Lab Work: None If you have labs (blood work) drawn today and your tests are completely normal, you will receive your results only by: Marland Kitchen MyChart Message (if you have MyChart) OR . A paper copy in the mail If you have any lab test that is abnormal or we need to change your treatment, we will call you to review the results.   Testing/Procedures: Your physician has requested that you have an echocardiogram. Echocardiography is a painless test that uses sound waves to create images of your heart. It provides your doctor with information about the size and shape of your heart and how well your heart's chambers and valves are working. This procedure takes approximately one hour. There are no restrictions for this procedure.  Non-Cardiac CT scanning, (CAT scanning), is a noninvasive, special x-ray that produces cross-sectional images of the body using x-rays and a computer. CT scans help physicians diagnose and treat medical conditions. For some CT exams, a contrast material is used to enhance visibility in the area of the body being studied. CT scans provide greater clarity and reveal more details than regular x-ray exams.     Follow-Up: At [facility], you and your health needs are our priority.  As part of our continuing mission to provide you with exceptional heart care, we have created designated Provider Care Teams.  These Care Teams include your primary Cardiologist (physician) and Advanced Practice Providers (APPs -  Physician Assistants and Nurse Practitioners) who all work together to provide you with the care you need, when you need it.  We recommend signing up for the patient portal called "MyChart".   Sign up information is provided on this After Visit Summary.  MyChart is used to connect with patients for Virtual Visits (Telemedicine).  Patients are able to view lab/test results, encounter notes, upcoming appointments, etc.  Non-urgent messages can be sent to your provider as well.   To learn more about what you can do with MyChart, go to [email or web address].    Your next appointment:   1 year(s)  The format for your next appointment:   In Person  Provider:   Gypsy Balsam, MD   Other Instructions   Echocardiogram An echocardiogram is a procedure that uses painless sound waves (ultrasound) to produce an image of the heart. Images from an echocardiogram can provide important information about:  Signs of coronary artery disease (CAD).  Aneurysm detection. An aneurysm is a weak or damaged part of an artery wall that bulges out from the normal force of blood pumping through the body.  Heart size and shape. Changes in the size or shape of the heart can be associated with certain conditions, including heart failure, aneurysm, and CAD.  Heart muscle function.  Heart valve function.  Signs of a past heart attack.  Fluid buildup around the heart.  Thickening of the heart muscle.  A tumor or infectious growth around the heart valves. Tell a health care provider about:  Any allergies you have.  All medicines you are taking, including vitamins, herbs, eye drops, creams, and over-the-counter medicines.  Any blood disorders you have.  Any surgeries you have had.  Any medical conditions you have.  Whether you are pregnant or may be pregnant. What  are the risks? Generally, this is a safe procedure. However, problems may occur, including:  Allergic reaction to dye (contrast) that may be used during the procedure. What happens before the procedure? No specific preparation is needed. You may eat and drink normally. What happens during the procedure?   An IV tube may  be inserted into one of your veins.  You may receive contrast through this tube. A contrast is an injection that improves the quality of the pictures from your heart.  A gel will be applied to your chest.  A wand-like tool (transducer) will be moved over your chest. The gel will help to transmit the sound waves from the transducer.  The sound waves will harmlessly bounce off of your heart to allow the heart images to be captured in real-time motion. The images will be recorded on a computer. The procedure may vary among health care providers and hospitals. What happens after the procedure?  You may return to your normal, everyday life, including diet, activities, and medicines, unless your health care provider tells you not to do that. Summary  An echocardiogram is a procedure that uses painless sound waves (ultrasound) to produce an image of the heart.  Images from an echocardiogram can provide important information about the size and shape of your heart, heart muscle function, heart valve function, and fluid buildup around your heart.  You do not need to do anything to prepare before this procedure. You may eat and drink normally.  After the echocardiogram is completed, you may return to your normal, everyday life, unless your health care provider tells you not to do that. This information is not intended to replace advice given to you by your health care provider. Make sure you discuss any questions you have with your health care provider. Document Revised: 04/03/2019 Document Reviewed: 01/13/2017 Elsevier Patient Education  2020 [employer].

## 2020-10-29 ENCOUNTER — Ambulatory Visit: Payer: BC Managed Care – PPO | Admitting: Cardiology

## 2020-12-26 ENCOUNTER — Other Ambulatory Visit: Payer: Self-pay | Admitting: Cardiology

## 2021-01-24 ENCOUNTER — Telehealth: Payer: Self-pay | Admitting: Cardiology

## 2021-01-24 DIAGNOSIS — Z01810 Encounter for preprocedural cardiovascular examination: Secondary | ICD-10-CM

## 2021-01-24 NOTE — Telephone Encounter (Signed)
Called patient informed her she needs labs before ct tomorrow, she will go today to get them.

## 2021-01-24 NOTE — Telephone Encounter (Signed)
Lawson Fiscal from [facility] states they need recent labs for the patient prior to her CT tomorrow.

## 2021-01-25 ENCOUNTER — Encounter (HOSPITAL_BASED_OUTPATIENT_CLINIC_OR_DEPARTMENT_OTHER): Payer: Self-pay

## 2021-01-25 ENCOUNTER — Other Ambulatory Visit: Payer: Self-pay

## 2021-01-25 ENCOUNTER — Ambulatory Visit (HOSPITAL_BASED_OUTPATIENT_CLINIC_OR_DEPARTMENT_OTHER)
Admission: RE | Admit: 2021-01-25 | Discharge: 2021-01-25 | Disposition: A | Discharge status: Home / Self Care | Payer: BC Managed Care – PPO | Source: Ambulatory Visit | Attending: Cardiology | Admitting: Cardiology

## 2021-01-25 DIAGNOSIS — I712 Thoracic aortic aneurysm, without rupture: Secondary | ICD-10-CM | POA: Diagnosis present

## 2021-01-25 DIAGNOSIS — Z8249 Family history of ischemic heart disease and other diseases of the circulatory system: Secondary | ICD-10-CM | POA: Diagnosis present

## 2021-01-25 DIAGNOSIS — I7121 Aneurysm of the ascending aorta, without rupture: Secondary | ICD-10-CM

## 2021-01-25 LAB — BASIC METABOLIC PANEL
BUN/Creatinine Ratio: 25 (ref 12–28)
BUN: 16 mg/dL (ref 8–27)
CO2: 25 mmol/L (ref 20–29)
Calcium: 9.6 mg/dL (ref 8.7–10.3)
Chloride: 98 mmol/L (ref 96–106)
Creatinine, Ser: 0.65 mg/dL (ref 0.57–1.00)
GFR calc Af Amer: 112 mL/min/{1.73_m2} (ref >59)
GFR calc non Af Amer: 97 mL/min/{1.73_m2} (ref >59)
Glucose: 140 mg/dL — ABNORMAL HIGH (ref 65–99)
Potassium: 4.2 mmol/L (ref 3.5–5.2)
Sodium: 141 mmol/L (ref 134–144)

## 2021-01-25 MED ORDER — IOHEXOL 350 MG/ML SOLN
100.0000 mL | Freq: Once | INTRAVENOUS | Status: AC | PRN
  Administered 2021-01-25: 100 mL via INTRAVENOUS

## 2021-02-08 ENCOUNTER — Ambulatory Visit (INDEPENDENT_AMBULATORY_CARE_PROVIDER_SITE_OTHER): Payer: BC Managed Care – PPO

## 2021-02-08 ENCOUNTER — Other Ambulatory Visit: Payer: Self-pay

## 2021-02-08 DIAGNOSIS — Q231 Congenital insufficiency of aortic valve: Secondary | ICD-10-CM | POA: Diagnosis not present

## 2021-02-08 LAB — ECHOCARDIOGRAM COMPLETE
Area-P 1/2: 4.15 cm2
P 1/2 time: 527 msec
S' Lateral: 3.4 cm

## 2021-02-08 NOTE — Progress Notes (Signed)
Complete echocardiogram performed.  Jimmy Pecola Haxton RDCS, RVT  

## 2021-02-09 ENCOUNTER — Telehealth: Payer: Self-pay | Admitting: Cardiology

## 2021-02-09 NOTE — Telephone Encounter (Signed)
Patient is returning call regarding echo results.  

## 2021-02-09 NOTE — Telephone Encounter (Signed)
Called patient informed her of results.  

## 2021-02-15 IMAGING — CT CT ANGIO CHEST
2 of 6 series · 18 of 46 positions shown · IV contrast (Omnipaque)
Comparison: 01/10/2019, 02/20/2020

CLINICAL DATA: Follow-up thoracic aortic aneurysm

EXAM:
CT ANGIOGRAPHY CHEST WITH CONTRAST
TECHNIQUE: Multidetector CT imaging of the chest was performed using the
standard protocol during bolus administration of intravenous
contrast. Multiplanar CT image reconstructions and MIPs were
obtained to evaluate the vascular anatomy.
CONTRAST:  100mL OMNIPAQUE IOHEXOL 350 MG/ML SOLN

[Series 4: axial arterial · axial · arterial · 0.73mm/px · z∈[-271,-22]mm · 15 of 93 slices shown]
[im 5/93  lung]
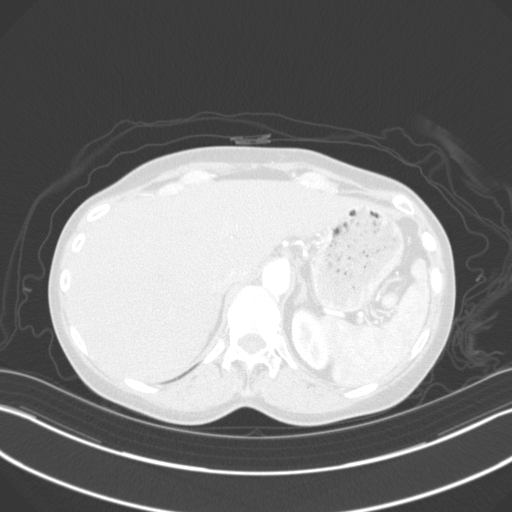
[im 10/93  soft-tissue]
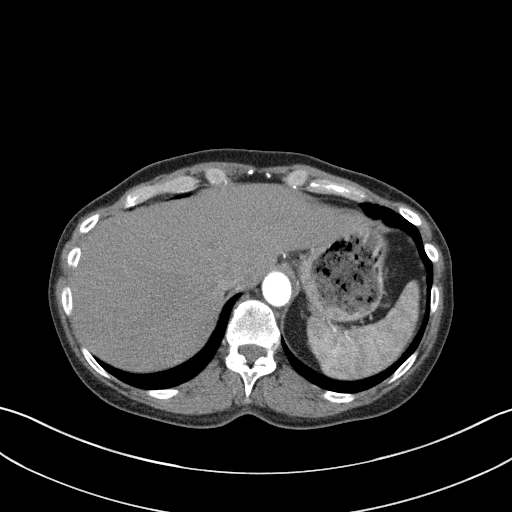
[im 20/93  lung]
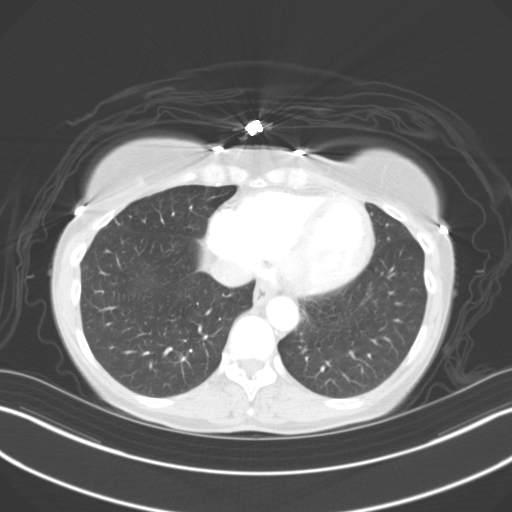
[im 25/93  soft-tissue]
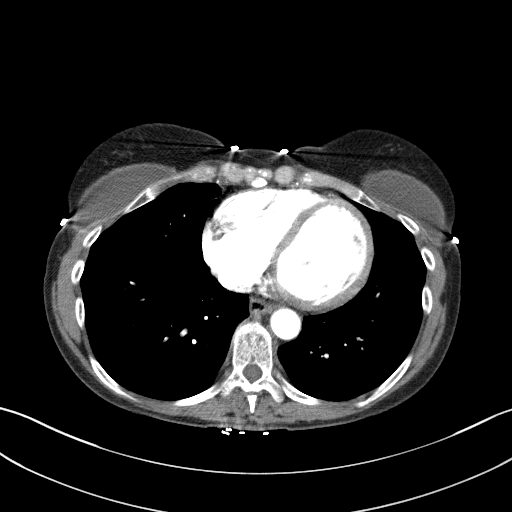
[im 30/93  lung]
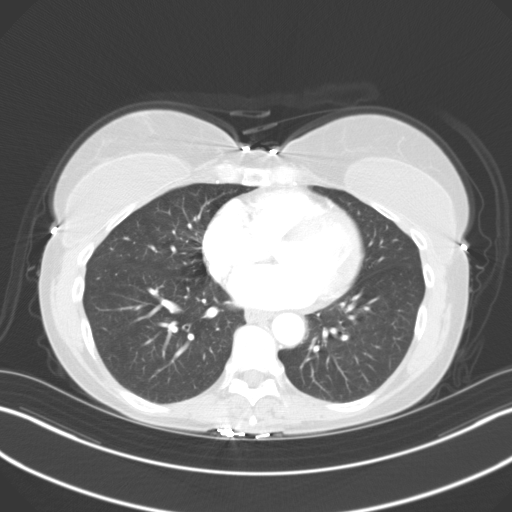
[im 34/93  soft-tissue]
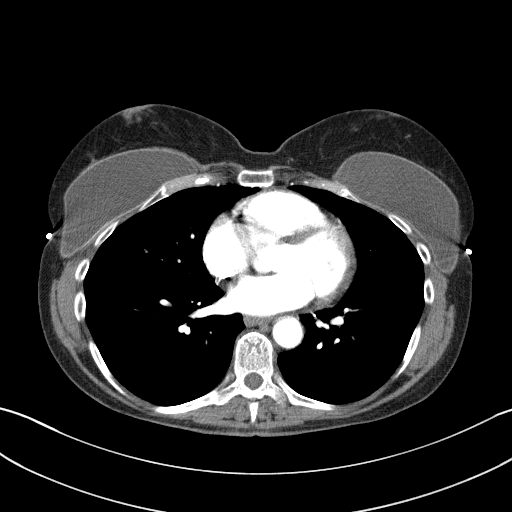
[im 39/93  lung]
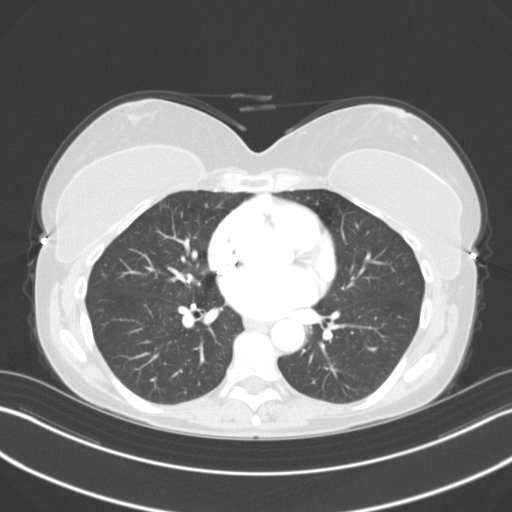
[im 49/93  soft-tissue]
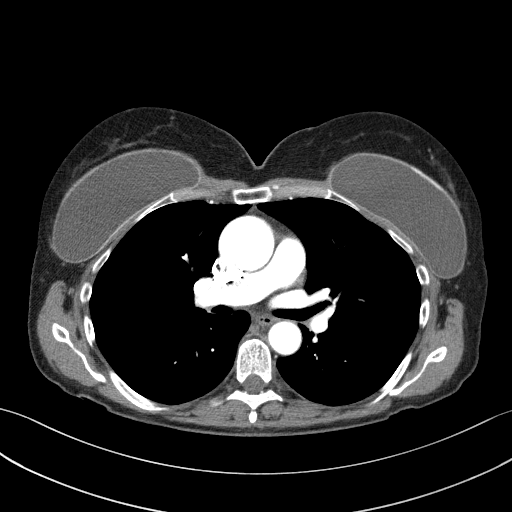
[im 54/93  lung]
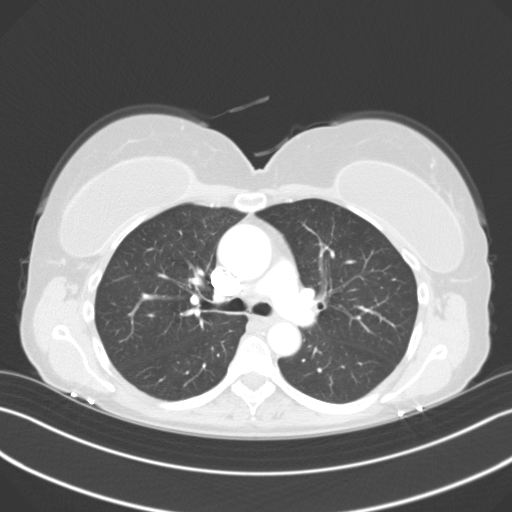
[im 59/93  soft-tissue]
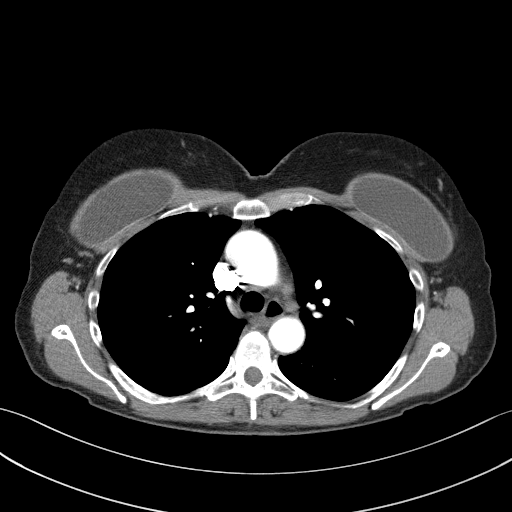
[im 63/93  lung]
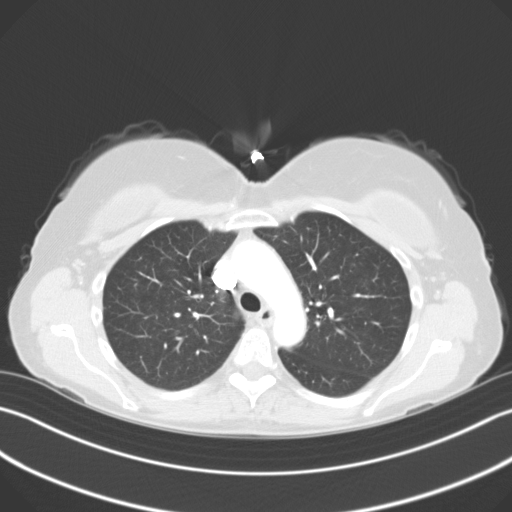
[im 68/93  soft-tissue]
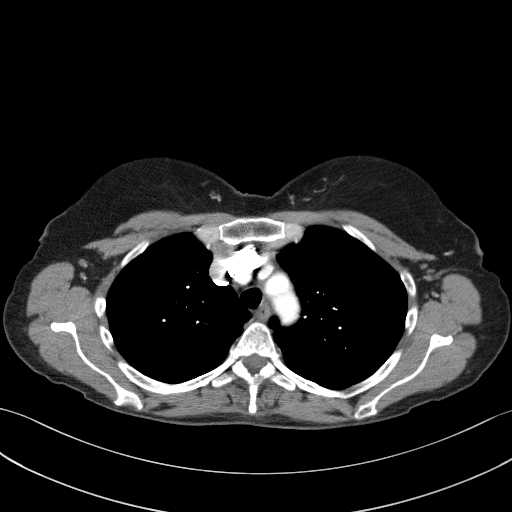
[im 78/93  lung]
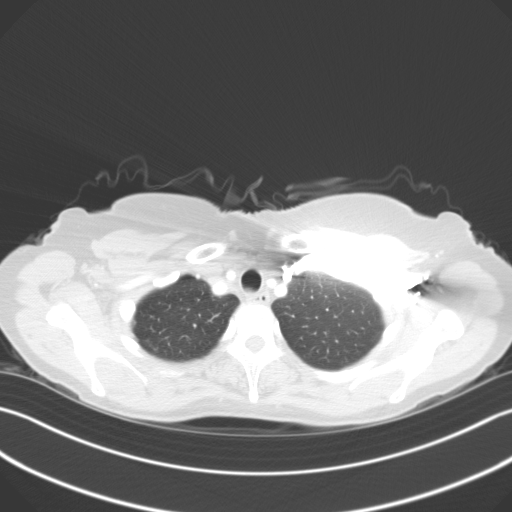
[im 83/93  soft-tissue]
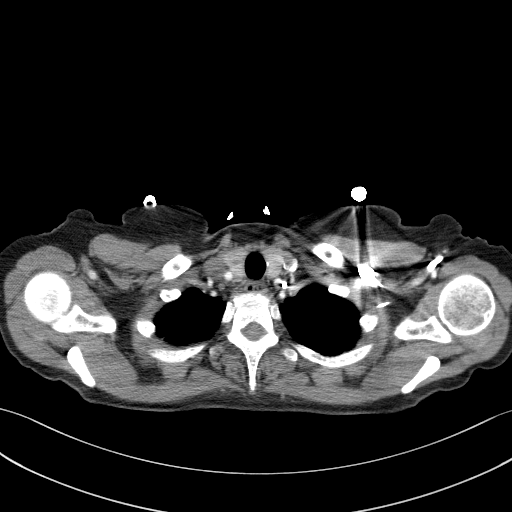
[im 88/93  lung]
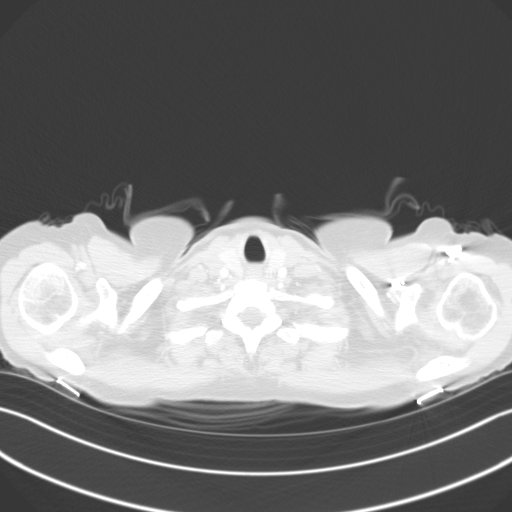

[Series 6: coronal · coronal · 0.57mm/px · 3 of 87 slices shown]
[im 22/87  soft-tissue]
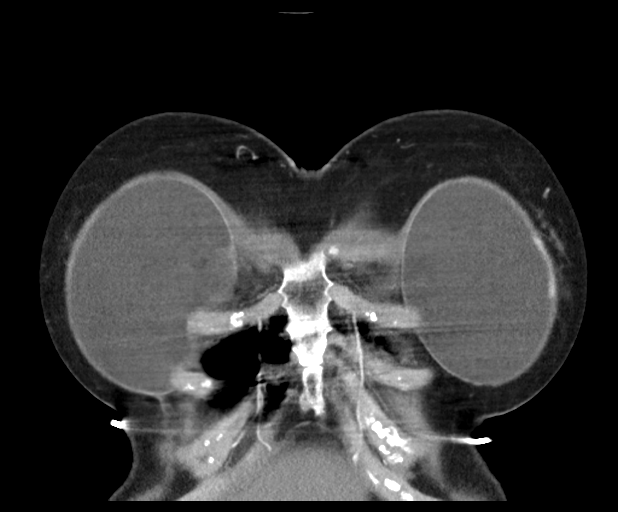
[im 44/87  soft-tissue]
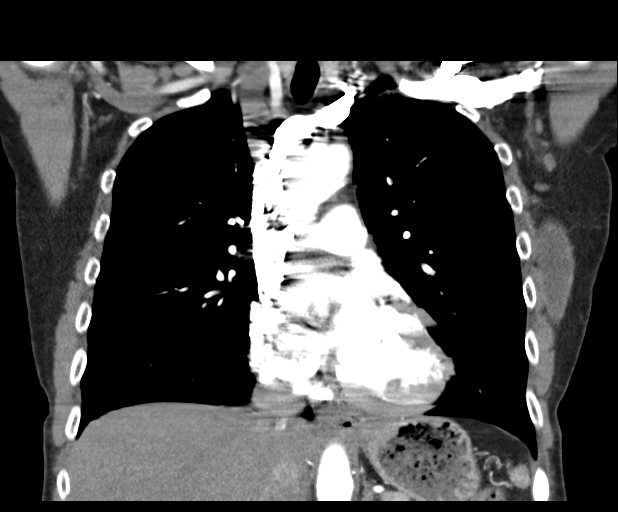
[im 65/87  soft-tissue]
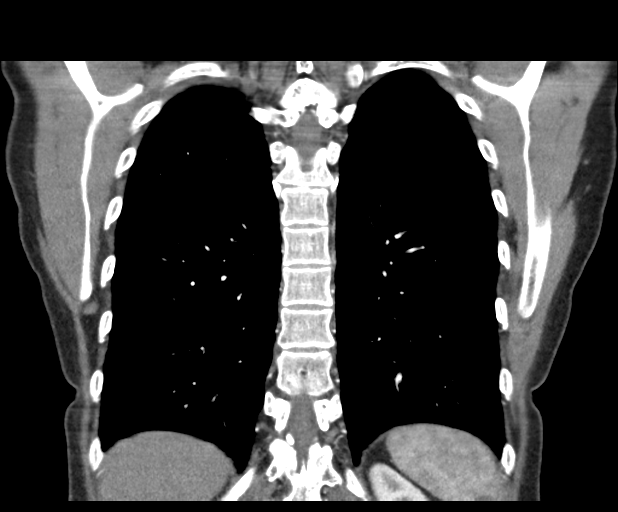

[18 of 46 positions shown; findings below may reference images not displayed]

FINDINGS: Cardiovascular: Dilatation of the ascending aorta to 4 cm is noted
and stable. Bicuspid aortic valve is seen. Normal tapering is noted
within the aortic arch. The descending thoracic aorta is within
normal limits.

Mediastinum/Nodes: Thoracic inlet is within normal limits. No hilar
or mediastinal adenopathy is noted. The esophagus as visualized is
within normal limits.

Lungs/Pleura: Lungs are well aerated bilaterally without focal
infiltrate or sizable effusion. No sizable parenchymal nodules are
noted.

Upper Abdomen: Visualized upper abdomen shows no acute abnormality.

Musculoskeletal: Bilateral breast implants are again seen and
stable. No acute bony abnormality is seen.

Review of the MIP images confirms the above findings.
IMPRESSION: Stable ascending aortic aneurysmal dilatation to 4 cm. Recommend
annual imaging followup by CTA or MRA. This recommendation follows
2909 ACCF/AHA/AATS/ACR/ASA/SCA/CHOSEN/ESAMATTI/TIGER/EDIRS Guidelines for the
Diagnosis and Management of Patients with Thoracic Aortic Disease.
Circulation. 2909; 121: [id number]. Aortic aneurysm NOS ([id number])

No new focal abnormality is noted.

## 2021-03-17 ENCOUNTER — Telehealth: Payer: Self-pay

## 2021-03-17 NOTE — Telephone Encounter (Signed)
   [facility] Pre-operative Risk Assessment    Request for surgical clearance:  1. What type of surgery is being performed? EGD/ Colonoscopy   2. When is this surgery scheduled? TBD   3. What type of clearance is required (medical clearance vs. Pharmacy clearance to hold med vs. Both)? Boht  4. Are there any medications that need to be held prior to surgery and how long?None specified   5. Practice name and name of physician performing surgery? [facility]   6. What is your office phone number: [phone]    7.   What is your office fax number:[phone]  8.   Anesthesia type (None, local, MAC, general) ? Not specified   Alexandra Shelton 03/17/2021, 12:31 PM  _________________________________________________________________   (provider comments below)

## 2021-03-17 NOTE — Telephone Encounter (Signed)
   Primary Cardiologist: Gypsy Balsam, MD  Chart reviewed as part of pre-operative protocol coverage. Patient was contacted 03/17/2021 in reference to pre-operative risk assessment for pending surgery as outlined below.  Elleen Coulibaly was last seen on 10/26/2020 by Dr. [name].  Since that day, Kimeka Badour has done well without chest pain or shortness of breath.  To clarify, patient mentions her procedure will only involve endoscopy.  I have reviewed her recent office visit in November 2021 with Dr. [name].  She has a history of thoracic aortic aneurysm and bicuspid aortic valve.  CTA of the chest obtained in February 2021 showed stable very mild aortic aneurysm measuring at 4.0 cm.  Subsequent echocardiogram obtained on 02/08/2021 showed EF normal at 60 to 65%, mild aortic regurgitation with bicuspid valve.  No significant valve disorder to interfere with upcoming procedure.  Therefore, based on ACC/AHA guidelines, the patient would be at acceptable risk for the planned procedure without further cardiovascular testing.   The patient was advised that if she develops new symptoms prior to surgery to contact our office to arrange for a follow-up visit, and she verbalized understanding.  I will route this recommendation to the requesting party via Epic fax function and remove from pre-op pool. Please call with questions.  [place], [place] 03/17/2021, 1:41 PM

## 2021-12-12 ENCOUNTER — Other Ambulatory Visit: Payer: Self-pay | Admitting: Cardiology

## 2021-12-28 ENCOUNTER — Other Ambulatory Visit: Payer: Self-pay

## 2021-12-29 ENCOUNTER — Other Ambulatory Visit: Payer: Self-pay

## 2021-12-29 ENCOUNTER — Encounter: Payer: Self-pay | Admitting: Cardiology

## 2021-12-29 ENCOUNTER — Ambulatory Visit: Payer: BC Managed Care – PPO | Admitting: Cardiology

## 2021-12-29 VITALS — BP 124/80 | HR 61 | Ht 68.0 in | Wt 139.8 lb

## 2021-12-29 DIAGNOSIS — Q2381 Bicuspid aortic valve: Secondary | ICD-10-CM

## 2021-12-29 DIAGNOSIS — Z8249 Family history of ischemic heart disease and other diseases of the circulatory system: Secondary | ICD-10-CM

## 2021-12-29 DIAGNOSIS — Q231 Congenital insufficiency of aortic valve: Secondary | ICD-10-CM

## 2021-12-29 DIAGNOSIS — I7121 Aneurysm of the ascending aorta, without rupture: Secondary | ICD-10-CM

## 2021-12-29 NOTE — Progress Notes (Signed)
Cardiology Office Note:    Date:  12/29/2021   ID:  [name], DOB 1961-06-17, MRN [id number]  PCP:  Noni Saupe, MD  Cardiologist:  Gypsy Balsam, MD    Referring MD: Noni Saupe, MD   Chief Complaint  Patient presents with   re-eval for Aneurysm    History of Present Illness:    Alexandra Shelton is a 61 y.o. female   with past medical history significant for ascending aortic aneurysm, measuring 4 cm based on last measurement but CT from February 2021, bicuspid aortic valve without significant stenosis no regurgitation, family history of aneurysms, however genetic testing negative so far. She comes today 2 months of follow-up overall she is doing very well she is asymptomatic.  Denies have any chest pain tightness squeezing pressure burning chest.  She exercise on the regular basis she does aerobic exercise.  She also does yoga.  She does not do any heavy lifting.  She does all precaution.  Past Medical History:  Diagnosis Date   Allergic rhinitis due to other allergen    Osteoporosis     Past Surgical History:  Procedure Laterality Date   BREAST ENHANCEMENT SURGERY      Current Medications: Current Meds  Medication Sig   fexofenadine (ALLEGRA) 180 MG tablet Take 180 mg by mouth as needed for allergies.   metoprolol succinate (TOPROL-XL) 25 MG 24 hr tablet Take 25 mg by mouth daily.   Multiple Vitamin (MULTIVITAMIN) capsule Take 1 capsule by mouth daily.   NASONEX 50 MCG/ACT nasal spray Place 2 sprays into the nose as needed (Allergies).   omeprazole (PRILOSEC) 40 MG capsule Take 40 mg by mouth every morning.   zoledronic acid (RECLAST) 5 MG/100ML SOLN injection Inject 5 mg into the vein once.     Allergies:   Sulfamethoxazole   Social History   Socioeconomic History   Marital status: Married    Spouse name: Not on file   Number of children: 3   Years of education: 38   Highest education level: Not on file  Occupational History   Occupation:  Librarian   Tobacco Use   Smoking status: Former   Smokeless tobacco: Never  Building services engineer Use: Never used  Substance and Sexual Activity   Alcohol use: Yes    Comment: occasionally wine   Drug use: No   Sexual activity: Not on file  Other Topics Concern   Not on file  Social History Narrative   Daily Caffeine Use-1-2 cups daily (coffee)   Regular exercise-yes   Social Determinants of Health   Financial Resource Strain: Not on file  Food Insecurity: Not on file  Transportation Needs: Not on file  Physical Activity: Not on file  Stress: Not on file  Social Connections: Not on file     Family History: The patient's family history includes AAA (abdominal aortic aneurysm) in her brother; Aortic dissection in her father; Other in her father and mother. ROS:   Please see the history of present illness.    All 14 point review of systems negative except as described per history of present illness  EKGs/Labs/Other Studies Reviewed:      Recent Labs: 01/24/2021: BUN 16; Creatinine, Ser 0.65; Potassium 4.2; Sodium 141  Recent Lipid Panel No results found for: CHOL, TRIG, HDL, CHOLHDL, VLDL, LDLCALC, LDLDIRECT  Physical Exam:    VS:  BP 124/80 (BP Location: Left Arm, Patient Position: Sitting)    Pulse 61  Ht 5\' 8"  (1.727 m)    Wt 139 lb 12.8 oz (63.4 kg)    BMI 21.26 kg/m     Wt Readings from Last 3 Encounters:  12/29/21 139 lb 12.8 oz (63.4 kg)  10/26/20 140 lb (63.5 kg)  09/18/19 142 lb 6.4 oz (64.6 kg)     GEN:  Well nourished, well developed in no acute distress HEENT: Normal NECK: No JVD; No carotid bruits LYMPHATICS: No lymphadenopathy CARDIAC: RRR, no murmurs, no rubs, no gallops RESPIRATORY:  Clear to auscultation without rales, wheezing or rhonchi  ABDOMEN: Soft, non-tender, non-distended MUSCULOSKELETAL:  No edema; No deformity  SKIN: Warm and dry LOWER EXTREMITIES: no swelling NEUROLOGIC:  Alert and oriented x 3 PSYCHIATRIC:  Normal affect    ASSESSMENT:    1. Aneurysm of ascending aorta without rupture   2. Bicuspid aortic valve   3. Family history of thoracic aortic aneurysm    PLAN:    In order of problems listed above:  Ascending aortic aneurysm measuring 4 cm in this lady with bicuspid arctic valve and family history of ruptured aneurysm.  We will repeat CT of her chest.  I will also schedule her to have another echocardiogram to reassess degree of aortic insufficiency. Bicuspid aortic valve: Echocardiogram will be repeated Family history of ruptured aneurysm noted obviously very significant in terms of prognosis and management. We will also try to make arrangements for fasting lipid profile to be done   Medication Adjustments/Labs and Tests Ordered: Current medicines are reviewed at length with the patient today.  Concerns regarding medicines are outlined above.  No orders of the defined types were placed in this encounter.  Medication changes: No orders of the defined types were placed in this encounter.   Signed, 09/20/19, MD, [facility] 12/29/2021 3:38 PM    [facility]

## 2021-12-29 NOTE — Patient Instructions (Signed)
Medication Instructions:  Your physician recommends that you continue on your current medications as directed. Please refer to the Current Medication list given to you today.  *If you need a refill on your cardiac medications before your next appointment, please call your pharmacy*   Lab Work: Your physician recommends that you return for lab work in: BMP today If you have labs (blood work) drawn today and your tests are completely normal, you will receive your results only by: MyChart Message (if you have MyChart) OR A paper copy in the mail If you have any lab test that is abnormal or we need to change your treatment, we will call you to review the results.   Testing/Procedures: Non-Cardiac CT Angiography (CTA), is a special type of CT scan that uses a computer to produce multi-dimensional views of major blood vessels throughout the body. In CT angiography, a contrast material is injected through an IV to help visualize the blood vessels   Your physician has requested that you have an echocardiogram. Echocardiography is a painless test that uses sound waves to create images of your heart. It provides your doctor with information about the size and shape of your heart and how well your hearts chambers and valves are working. This procedure takes approximately one hour. There are no restrictions for this procedure.    Follow-Up: At [facility], you and your health needs are our priority.  As part of our continuing mission to provide you with exceptional heart care, we have created designated Provider Care Teams.  These Care Teams include your primary Cardiologist (physician) and Advanced Practice Providers (APPs -  Physician Assistants and Nurse Practitioners) who all work together to provide you with the care you need, when you need it.  We recommend signing up for the patient portal called "MyChart".  Sign up information is provided on this After Visit Summary.  MyChart is used to  connect with patients for Virtual Visits (Telemedicine).  Patients are able to view lab/test results, encounter notes, upcoming appointments, etc.  Non-urgent messages can be sent to your provider as well.   To learn more about what you can do with MyChart, go to [email or web address].    Your next appointment:   1 year(s)  The format for your next appointment:   In Person  Provider:   Gypsy Balsam, MD    Other Instructions

## 2021-12-30 ENCOUNTER — Telehealth: Payer: Self-pay

## 2021-12-30 LAB — BASIC METABOLIC PANEL
BUN/Creatinine Ratio: 21 (ref 12–28)
BUN: 15 mg/dL (ref 8–27)
CO2: 28 mmol/L (ref 20–29)
Calcium: 9.9 mg/dL (ref 8.7–10.3)
Chloride: 95 mmol/L — ABNORMAL LOW (ref 96–106)
Creatinine, Ser: 0.7 mg/dL (ref 0.57–1.00)
Glucose: 88 mg/dL (ref 70–99)
Potassium: 4.2 mmol/L (ref 3.5–5.2)
Sodium: 136 mmol/L (ref 134–144)
eGFR: 98 mL/min/{1.73_m2} (ref >59)

## 2021-12-30 NOTE — Telephone Encounter (Signed)
Patient notified of results.

## 2021-12-30 NOTE — Telephone Encounter (Signed)
-----   Message from Georgeanna Lea, MD sent at 12/30/2021 11:13 AM EST ----- Chem-7 looks good

## 2022-01-17 ENCOUNTER — Ambulatory Visit (HOSPITAL_BASED_OUTPATIENT_CLINIC_OR_DEPARTMENT_OTHER)
Admission: RE | Admit: 2022-01-17 | Discharge: 2022-01-17 | Disposition: A | Discharge status: Home / Self Care | Payer: BC Managed Care – PPO | Source: Ambulatory Visit | Attending: Cardiology | Admitting: Cardiology

## 2022-01-17 ENCOUNTER — Encounter (HOSPITAL_BASED_OUTPATIENT_CLINIC_OR_DEPARTMENT_OTHER): Payer: Self-pay

## 2022-01-17 ENCOUNTER — Other Ambulatory Visit: Payer: Self-pay

## 2022-01-17 DIAGNOSIS — Q231 Congenital insufficiency of aortic valve: Secondary | ICD-10-CM | POA: Insufficient documentation

## 2022-01-17 DIAGNOSIS — I7121 Aneurysm of the ascending aorta, without rupture: Secondary | ICD-10-CM | POA: Insufficient documentation

## 2022-01-17 DIAGNOSIS — Z8249 Family history of ischemic heart disease and other diseases of the circulatory system: Secondary | ICD-10-CM | POA: Diagnosis present

## 2022-01-17 LAB — ECHOCARDIOGRAM COMPLETE
AR max vel: 1.43 cm2
AV Area VTI: 1.34 cm2
AV Area mean vel: 1.24 cm2
AV Mean grad: 8 mmHg
AV Peak grad: 13.5 mmHg
Ao pk vel: 1.84 m/s
Area-P 1/2: 2.41 cm2
P 1/2 time: 806 msec
S' Lateral: 2.9 cm

## 2022-01-17 MED ORDER — IOHEXOL 350 MG/ML SOLN
100.0000 mL | Freq: Once | INTRAVENOUS | Status: AC | PRN
  Administered 2022-01-17: 15:00:00 100 mL via INTRAVENOUS

## 2022-01-17 NOTE — Progress Notes (Signed)
°  Echocardiogram 2D Echocardiogram has been performed.  Roosvelt Maser F 01/17/2022, 4:31 PM

## 2022-01-23 ENCOUNTER — Telehealth: Payer: Self-pay | Admitting: Cardiology

## 2022-01-23 NOTE — Telephone Encounter (Signed)
°  Patient called stating she was returning a call from [place].

## 2022-01-24 NOTE — Telephone Encounter (Signed)
Patient informed of result.

## 2022-01-25 ENCOUNTER — Other Ambulatory Visit (HOSPITAL_BASED_OUTPATIENT_CLINIC_OR_DEPARTMENT_OTHER): Payer: BC Managed Care – PPO

## 2022-10-16 ENCOUNTER — Other Ambulatory Visit: Payer: Self-pay | Admitting: Cardiology

## 2022-10-16 ENCOUNTER — Other Ambulatory Visit: Payer: Self-pay

## 2022-10-16 MED ORDER — METOPROLOL SUCCINATE ER 25 MG PO TB24: 25.0000 mg | ORAL_TABLET | Freq: Every day | ORAL | 0 refills | Status: DC

## 2023-02-12 ENCOUNTER — Other Ambulatory Visit: Payer: Self-pay | Admitting: Cardiology

## 2023-02-12 MED ORDER — METOPROLOL SUCCINATE ER 25 MG PO TB24: 25.0000 mg | ORAL_TABLET | Freq: Every day | ORAL | 3 refills | Status: DC

## 2024-02-09 ENCOUNTER — Other Ambulatory Visit: Payer: Self-pay | Admitting: Cardiology

## 2024-02-11 NOTE — Telephone Encounter (Signed)
 Prescription sent to pharmacy.
